# Patient Record
Sex: Male | Born: 1935 | Race: White | Hispanic: No | Marital: Married | Smoking: Former smoker
Health system: Southern US, Community
[De-identification: ages and names within clinical notes are randomized; demographics above are authoritative.]

## PROBLEM LIST (undated history)

## (undated) DIAGNOSIS — E785 Hyperlipidemia, unspecified: Secondary | ICD-10-CM

## (undated) DIAGNOSIS — N200 Calculus of kidney: Secondary | ICD-10-CM

## (undated) DIAGNOSIS — F329 Major depressive disorder, single episode, unspecified: Secondary | ICD-10-CM

## (undated) DIAGNOSIS — C61 Malignant neoplasm of prostate: Secondary | ICD-10-CM

## (undated) DIAGNOSIS — G4730 Sleep apnea, unspecified: Secondary | ICD-10-CM

## (undated) DIAGNOSIS — K219 Gastro-esophageal reflux disease without esophagitis: Secondary | ICD-10-CM

## (undated) DIAGNOSIS — I519 Heart disease, unspecified: Secondary | ICD-10-CM

## (undated) DIAGNOSIS — F0390 Unspecified dementia without behavioral disturbance: Secondary | ICD-10-CM

## (undated) DIAGNOSIS — J189 Pneumonia, unspecified organism: Secondary | ICD-10-CM

## (undated) DIAGNOSIS — I2510 Atherosclerotic heart disease of native coronary artery without angina pectoris: Secondary | ICD-10-CM

## (undated) DIAGNOSIS — F32A Depression, unspecified: Secondary | ICD-10-CM

## (undated) DIAGNOSIS — T8859XA Other complications of anesthesia, initial encounter: Secondary | ICD-10-CM

## (undated) DIAGNOSIS — I639 Cerebral infarction, unspecified: Secondary | ICD-10-CM

## (undated) DIAGNOSIS — N2889 Other specified disorders of kidney and ureter: Secondary | ICD-10-CM

## (undated) DIAGNOSIS — T4145XA Adverse effect of unspecified anesthetic, initial encounter: Secondary | ICD-10-CM

## (undated) HISTORY — PX: BACK SURGERY: SHX140

## (undated) HISTORY — PX: SKIN GRAFT: SHX250

## (undated) HISTORY — PX: CERVICAL DISCECTOMY: SHX98

## (undated) HISTORY — PX: MITRAL VALVE REPAIR: SHX2039

## (undated) HISTORY — PX: RETINAL DETACHMENT REPAIR W/ SCLERAL BUCKLE LE: SHX2338

## (undated) HISTORY — PX: LITHOTRIPSY: SUR834

## (undated) HISTORY — PX: LUMBAR LAMINECTOMY: SHX95

## (undated) HISTORY — PX: PARS PLANA VITRECTOMY W/ REPAIR OF MACULAR HOLE: SHX2170

## (undated) HISTORY — PX: CSF SHUNT: SHX92

## (undated) HISTORY — PX: PROSTATE SURGERY: SHX751

## (undated) HISTORY — PX: EYE SURGERY: SHX253

---

## 2011-04-16 HISTORY — PX: CORONARY ARTERY BYPASS GRAFT: SHX141

## 2015-08-07 HISTORY — PX: CARDIAC CATHETERIZATION: SHX172

## 2015-11-13 ENCOUNTER — Other Ambulatory Visit: Payer: Self-pay | Admitting: Urology

## 2015-11-13 DIAGNOSIS — N2889 Other specified disorders of kidney and ureter: Secondary | ICD-10-CM

## 2015-12-07 ENCOUNTER — Ambulatory Visit
Admission: RE | Admit: 2015-12-07 | Discharge: 2015-12-07 | Disposition: A | Discharge status: Home / Self Care | Payer: Self-pay | Source: Ambulatory Visit | Attending: Urology | Admitting: Urology

## 2015-12-07 ENCOUNTER — Encounter: Payer: Self-pay | Admitting: Radiology

## 2015-12-07 DIAGNOSIS — N2889 Other specified disorders of kidney and ureter: Secondary | ICD-10-CM

## 2015-12-07 HISTORY — DX: Gastro-esophageal reflux disease without esophagitis: K21.9

## 2015-12-07 HISTORY — DX: Sleep apnea, unspecified: G47.30

## 2015-12-07 HISTORY — DX: Major depressive disorder, single episode, unspecified: F32.9

## 2015-12-07 HISTORY — DX: Other specified disorders of kidney and ureter: N28.89

## 2015-12-07 HISTORY — PX: IR GENERIC HISTORICAL: IMG1180011

## 2015-12-07 HISTORY — DX: Atherosclerotic heart disease of native coronary artery without angina pectoris: I25.10

## 2015-12-07 HISTORY — DX: Hyperlipidemia, unspecified: E78.5

## 2015-12-07 HISTORY — DX: Cerebral infarction, unspecified: I63.9

## 2015-12-07 HISTORY — DX: Calculus of kidney: N20.0

## 2015-12-07 HISTORY — DX: Heart disease, unspecified: I51.9

## 2015-12-07 HISTORY — DX: Depression, unspecified: F32.A

## 2015-12-07 HISTORY — DX: Malignant neoplasm of prostate: C61

## 2015-12-07 NOTE — Consult Note (Signed)
Chief Complaint: Patient was seen in consultation today for ablation of a right renal mass at the request of Hall,Marshall C  Referring Physician(s): Hall,Marshall C  History of Present Illness: Kenneth Todd is a 82 y.o. male with imaging evidence of a 2.6 cm solid, enhancing right renal mass emanating from the lateral interpolar kidney. This mass was initially incidentally detected by CT on 09/16/2015 at [facility] during the workup of biliary sepsis and choledocholithiasis requiring ERCP with stone extraction. Further characterization was performed with MRI of the abdomen on 10/28/2015 at [facility]. This demonstrated a 2.6 cm solid mass demonstrating enhancement and consistent with probable renal carcinoma. This is largely an endophytic lesion with minimal protrusion into the central sinus fat and slight exophytic contour bulge. No evidence of regional metastatic disease or renal vein involvement by MRI. Kenneth Todd is asymptomatic with respect to the mass and has not had any flank pain or hematuria. He is now fully recovered after hospitalization for biliary sepsis and endoscopic biliary intervention.  Past Medical History:  Diagnosis Date  . Coronary artery disease   . Depression   . GERD (gastroesophageal reflux disease)   . Heart disease   . Hyperlipidemia   . Kidney stone   . Prostate cancer ([place])   . Right renal mass   . Sleep apnea   . Stroke [facility])     Past Surgical History:  Procedure Laterality Date  . BACK SURGERY    . CARDIAC CATHETERIZATION  08/07/2015  . CERVICAL DISCECTOMY    . CORONARY ARTERY BYPASS GRAFT  2013  . CSF SHUNT    . EYE SURGERY    . LITHOTRIPSY    . LUMBAR LAMINECTOMY    . MITRAL VALVE REPAIR    . PARS PLANA VITRECTOMY W/ REPAIR OF MACULAR HOLE    . PROSTATE SURGERY    . RETINAL DETACHMENT REPAIR W/ SCLERAL BUCKLE LE    . SKIN GRAFT      Allergies: Celecoxib; Cetirizine & related; Ezetimibe; Naproxen; Trazodone  and nefazodone; Hydrochlorothiazide; and Triamcinolone acetonide  Medications: Prior to Admission medications   Medication Sig Start Date End Date Taking? Authorizing Provider  ALPRAZolam Duanne Moron) 0.5 MG tablet Take 0.5 mg by mouth at bedtime as needed for anxiety. 1/2-1 tab three times each day as needed.   Yes Historical Provider, MD  atorvastatin (LIPITOR) 40 MG tablet Take 40 mg by mouth Todd.   Yes Historical Provider, MD  Memantine HCl ER (NAMENDA XR) 21 MG CP24 Take 21 mg by mouth Todd.   Yes Historical Provider, MD  valACYclovir (VALTREX) 500 MG tablet Take 500 mg by mouth 2 (two) times Todd as needed. Take 1-3 per day as needed for eve virus.   Yes Historical Provider, MD     No family history on file.  Social History   Social History  . Marital status: Married    Spouse name: N/A  . Number of children: N/A  . Years of education: N/A   Social History Main Topics  . Smoking status: Former Smoker    Packs/day: 1.00    Years: 26.00    Types: Cigarettes    Start date: 12/06/1948    Quit date: 12/07/1974  . Smokeless tobacco: Never Used  . Alcohol use No     Comment: stopped about 7 yrs ago    . Drug use: Unknown  . Sexual activity: Not Asked   Other Topics Concern  . None  Social History Narrative  . None    Review of Systems: A 12 point ROS discussed and pertinent positives are indicated in the HPI above.  All other systems are negative.  Review of Systems  Constitutional: Negative.   Respiratory: Negative.   Cardiovascular: Negative.   Gastrointestinal: Negative.   Genitourinary: Negative.   Musculoskeletal: Negative.   Neurological: Negative.   Hematological: Negative.     Vital Signs: BP (P) 132/81 (BP Location: Left Arm, Patient Position: Sitting, Cuff Size: Normal)   Pulse (P) 71   Temp (P) 97.6 F (36.4 C) (Oral)   Resp (P) 15   Ht _0  (1.778 m)   Wt 190 lb (86.2 kg)   SpO2 (P) 97%   BMI 27.26 kg/m   Physical Exam  Constitutional: He  is oriented to person, place, and time. He appears well-developed and well-nourished. No distress.  Neck: Neck supple. No JVD present.  Cardiovascular: Normal rate, regular rhythm and normal heart sounds.  Exam reveals no gallop and no friction rub.   No murmur heard. Pulmonary/Chest: Effort normal and breath sounds normal. No respiratory distress. He has no wheezes. He has no rales.  Abdominal: Soft. Bowel sounds are normal. He exhibits no distension and no mass. There is no tenderness. There is no rebound and no guarding.  Musculoskeletal: He exhibits no edema.  Lymphadenopathy:    He has no cervical adenopathy.  Neurological: He is alert and oriented to person, place, and time.  Skin: He is not diaphoretic.  Nursing note and vitals reviewed.   Imaging: CT of the abdomen and pelvis with contrast on 09/16/2015 as well as MRI of the abdomen with and without contrast on 10/28/2015 were reviewed.  Labs:  BUN 14, Cr 0.7 and GFR 87 mL/min on 10/12/15.  Assessment and Plan:  I met with Kenneth Todd and his wife. We reviewed imaging studies that reveal a 2.6 cm, rounded, enhancing solid mass of the right kidney most likely representing renal carcinoma. The mass is of a size and location amenable to percutaneous cryoablation. Given his medical comorbidities, he is not the best candidate for partial nephrectomy. Details of percutaneous ablation were discussed with the patient and his wife, including technical details, risks and typical follow-up. The procedure would be performed at [facility] and requires overnight observation.  Kenneth Todd states that he was a difficult intubation for a cervical spine surgery in the past which resulted in esophageal injury. We will let anesthesia know at [facility].  He has had several other uncomplicated intubations for multiple prior surgical procedures. Given history of CABG in 2013 and prior mitral valve replacement, we will contact Dr. Chrissie Noa  Rhinehart's office to determine whether he needs to be seen for formal cardiac clearance prior to ablation of the renal tumor.  In the meantime, we will begin the authorization and scheduling process.  Thank you for this interesting consult.  I greatly enjoyed meeting Kenneth Todd and look forward to participating in their care.  A copy of this report was sent to the requesting provider on this date.  Electronically SignedAletta Edouard T 12/07/2015, 11:14 AM   I spent a total of 40 Minutes in face to face in clinical consultation, greater than 50% of which was counseling/coordinating care for ablation of a right renal tumor.

## 2015-12-12 ENCOUNTER — Inpatient Hospital Stay: Admission: RE | Admit: 2015-12-12 | Payer: Self-pay | Source: Ambulatory Visit

## 2016-01-25 HISTORY — PX: OTHER SURGICAL HISTORY: SHX169

## 2016-01-30 ENCOUNTER — Other Ambulatory Visit: Payer: Self-pay | Admitting: *Deleted

## 2016-01-30 ENCOUNTER — Other Ambulatory Visit (HOSPITAL_COMMUNITY): Payer: Self-pay | Admitting: Interventional Radiology

## 2016-01-30 DIAGNOSIS — N2889 Other specified disorders of kidney and ureter: Secondary | ICD-10-CM

## 2016-02-22 LAB — CREATININE WITH EST GFR
CREATININE: 0.98 mg/dL (ref 0.70–1.11)
GFR, EST AFRICAN AMERICAN: 82 mL/min (ref >=60)
GFR, Est Non African American: 71 mL/min (ref >=60)

## 2016-02-22 LAB — BUN: BUN: 23 mg/dL (ref 7–25)

## 2016-02-27 ENCOUNTER — Ambulatory Visit
Admission: RE | Admit: 2016-02-27 | Discharge: 2016-02-27 | Disposition: A | Discharge status: Home / Self Care | Payer: Self-pay | Source: Ambulatory Visit | Attending: Interventional Radiology | Admitting: Interventional Radiology

## 2016-02-27 DIAGNOSIS — N2889 Other specified disorders of kidney and ureter: Secondary | ICD-10-CM

## 2016-02-27 HISTORY — PX: IR GENERIC HISTORICAL: IMG1180011

## 2016-02-27 NOTE — Progress Notes (Signed)
Chief Complaint: Status post cryoablation of a right clear cell renal carcinoma on 01/25/2016.  History of Present Illness: Kenneth Todd is a 82 y.o. male status post biopsy and percutaneous cryoablation of a right renal tumor on 01/25/2016. 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 He did well after the procedure. He states that it did take him a couple weeks to fully recover from general anesthesia. He now feels that he is back to his baseline. He is asymptomatic.  Past Medical History:  Diagnosis Date  . Coronary artery disease   . Depression   . GERD (gastroesophageal reflux disease)   . Heart disease   . Hyperlipidemia   . Kidney stone   . Prostate cancer ([place])   . Right renal mass   . Sleep apnea   . Stroke [facility])     Past Surgical History:  Procedure Laterality Date  . BACK SURGERY    . CARDIAC CATHETERIZATION  08/07/2015  . CERVICAL DISCECTOMY    . CORONARY ARTERY BYPASS GRAFT  2013  . Cryoablation of Right Renal Clear Cell Carcinoma Right 01/25/2016  . CSF SHUNT    . EYE SURGERY    . LITHOTRIPSY    . LUMBAR LAMINECTOMY    . MITRAL VALVE REPAIR    . PARS PLANA VITRECTOMY W/ REPAIR OF MACULAR HOLE    . PROSTATE SURGERY    . RETINAL DETACHMENT REPAIR W/ SCLERAL BUCKLE LE    . SKIN GRAFT      Allergies: Celecoxib; Cetirizine & related; Ezetimibe; Naproxen; Trazodone and nefazodone; Hydrochlorothiazide; and Triamcinolone acetonide  Medications: Prior to Admission medications   Medication Sig Start Date End Date Taking? Authorizing Provider  acetaminophen (TYLENOL) 500 MG chewable tablet Chew 500 mg by mouth every 6 (six) hours as needed for pain.    Historical Provider, MD  ALPRAZolam Duanne Moron) 0.5 MG tablet Take 0.5 mg by mouth at bedtime as needed for anxiety. 1/2-1 tab three times each day as needed.    Historical Provider, MD  aspirin 81 MG chewable tablet Chew by mouth daily.    Historical Provider, MD  atorvastatin  (LIPITOR) 40 MG tablet Take 40 mg by mouth daily.    Historical Provider, MD  cholecalciferol (VITAMIN D) 1000 units tablet Take 1,000 Units by mouth daily.    Historical Provider, MD  co-enzyme Q-10 30 MG capsule Take 30 mg by mouth daily.    Historical Provider, MD  Cyanocobalamin (VITAMIN B 12 PO) Take 1 tablet by mouth 2 (two) times daily.    Historical Provider, MD  diphenhydrAMINE (BENADRYL) 25 mg capsule Take 25 mg by mouth every 6 (six) hours as needed.    Historical Provider, MD  hydrocodone-acetaminophen (ZYDONE) 7.5-400 MG tablet Take 1 tablet by mouth 2 (two) times daily as needed for pain.    Historical Provider, MD  Memantine HCl ER (NAMENDA XR) 21 MG CP24 Take 21 mg by mouth daily.    Historical Provider, MD  metoprolol succinate (TOPROL-XL) 25 MG 24 hr tablet Take 25 mg by mouth 2 (two) times daily.    Historical Provider, MD  nitroGLYCERIN (NITROSTAT) 0.4 MG SL tablet Place 0.4 mg under the tongue every 5 (five) minutes as needed for chest pain.    Historical Provider, MD  ondansetron (ZOFRAN) 4 MG tablet Take 4 mg by mouth every 8 (eight) hours as needed for nausea or vomiting.    Historical Provider, MD  sertraline (ZOLOFT) 100 MG tablet Take 100 mg by  mouth 2 (two) times daily.    Historical Provider, MD  traMADol (ULTRAM) 50 MG tablet Take by mouth every 6 (six) hours as needed.    Historical Provider, MD  valACYclovir (VALTREX) 500 MG tablet Take 500 mg by mouth 2 (two) times daily as needed. Take 1-3 per day as needed for eve virus.    Historical Provider, MD     No family history on file.  Social History   Social History  . Marital status: Married    Spouse name: N/A  . Number of children: N/A  . Years of education: N/A   Social History Main Topics  . Smoking status: Former Smoker    Packs/day: 1.00    Years: 26.00    Types: Cigarettes    Start date: 12/06/1948    Quit date: 12/07/1974  . Smokeless tobacco: Never Used  . Alcohol use No     Comment: stopped  about 7 yrs ago    . Drug use: Unknown  . Sexual activity: Not on file   Other Topics Concern  . Not on file   Social History Narrative  . No narrative on file    ECOG Status: 0 - Asymptomatic  Review of Systems: A 12 point ROS discussed and pertinent positives are indicated in the HPI above.  All other systems are negative.  Review of Systems  Constitutional: Negative.   Respiratory: Negative.   Cardiovascular: Negative.   Gastrointestinal: Negative.   Genitourinary: Negative.   Musculoskeletal: Negative.   Neurological: Negative.     Vital Signs: BP 137/66 (BP Location: Left Arm, Patient Position: Sitting, Cuff Size: Normal)   Pulse (!) 59   Temp 97.9 F (36.6 C) (Oral)   Resp 14   Ht 5\' 10"  (1.778 m)   Wt 200 lb (90.7 kg)   SpO2 98%   BMI 28.70 kg/m   Physical Exam  Constitutional: He is oriented to person, place, and time. He appears well-developed and well-nourished. No distress.  Abdominal: Soft. He exhibits no distension and no mass. There is no tenderness. There is no rebound and no guarding.  Neurological: He is alert and oriented to person, place, and time.  Skin: He is not diaphoretic.  Nursing note and vitals reviewed.   Imaging: No results found.  Labs:  CBC: No results for input(s): WBC, HGB, HCT, PLT in the last 8760 hours.  COAGS: No results for input(s): INR, APTT in the last 8760 hours.  BMP:  Recent Labs  02/21/16 1430  BUN 23  CREATININE 0.98  GFRNONAA 71  GFRAA 82    Assessment and Plan:  Renal function is stable and within normal limits after ablation. I recommended that we perform a follow-up CT of the abdomen with and without contrast in January, 3 months after ablation. He may be in [place] at that time for the winter but still has not decided whether he and his wife will be traveling to [place]. If he is away, we will schedule the CT as soon as he returns back to [place].  Electronically SignedAletta Edouard  T 02/27/2016, 12:46 PM   I spent a total of 15 Minutes in face to face in clinical consultation, greater than 50% of which was counseling/coordinating care post cryoablation of a right renal carcinoma.

## 2016-03-05 ENCOUNTER — Encounter: Payer: Self-pay | Admitting: Interventional Radiology

## 2016-04-18 ENCOUNTER — Encounter: Payer: Self-pay | Admitting: Interventional Radiology

## 2016-04-22 ENCOUNTER — Other Ambulatory Visit: Payer: Self-pay | Admitting: *Deleted

## 2016-04-22 DIAGNOSIS — C641 Malignant neoplasm of right kidney, except renal pelvis: Secondary | ICD-10-CM

## 2016-04-25 ENCOUNTER — Other Ambulatory Visit: Payer: Self-pay | Admitting: Interventional Radiology

## 2016-04-25 ENCOUNTER — Other Ambulatory Visit: Payer: Self-pay | Admitting: *Deleted

## 2016-04-25 DIAGNOSIS — C641 Malignant neoplasm of right kidney, except renal pelvis: Secondary | ICD-10-CM

## 2016-05-08 ENCOUNTER — Ambulatory Visit
Admission: RE | Admit: 2016-05-08 | Discharge: 2016-05-08 | Disposition: A | Discharge status: Home / Self Care | Payer: Self-pay | Source: Ambulatory Visit | Attending: Interventional Radiology | Admitting: Interventional Radiology

## 2016-05-08 ENCOUNTER — Other Ambulatory Visit: Payer: Self-pay | Admitting: Interventional Radiology

## 2016-05-08 ENCOUNTER — Encounter: Payer: Self-pay | Admitting: Radiology

## 2016-05-08 ENCOUNTER — Ambulatory Visit
Admission: RE | Admit: 2016-05-08 | Discharge: 2016-05-08 | Disposition: A | Discharge status: Home / Self Care | Payer: Medicare Other | Source: Ambulatory Visit | Attending: Interventional Radiology | Admitting: Interventional Radiology

## 2016-05-08 DIAGNOSIS — C641 Malignant neoplasm of right kidney, except renal pelvis: Secondary | ICD-10-CM

## 2016-05-08 HISTORY — PX: IR GENERIC HISTORICAL: IMG1180011

## 2016-05-08 NOTE — Progress Notes (Signed)
Chief Complaint: Patient was seen in consultation today for  Chief Complaint  Patient presents with  . Follow-up    3.5 mo follow up Cryoablation of Right Renal Clear Cell Carcinoma     Referring Physician(s): Jonette Eva  Supervising Physician: Aletta Edouard  History of Present Illness: Kenneth Todd is a 83 y.o. male with history of imaging evidence of a 2.6 cm solid, enhancing right renal mass emanating from the lateral interpolar kidney.   This mass was initially incidentally detected by CT on 09/16/2015 at [facility] during the workup of biliary sepsis and choledocholithiasis requiring ERCP with stone extraction.   Further characterization was performed with MRI of the abdomen on 10/28/2015 at [facility].   This demonstrated a 2.6 cm solid mass demonstrating enhancement and consistent with probable renal carcinoma.   This is largely an endophytic lesion with minimal protrusion into the central sinus fat and slight exophytic contour bulge. No evidence of regional metastatic disease or renal vein involvement by MRI.   He underwent cryoablation by Dr. Kathlene Cote on 01/25/2016.  He is doing well from the cryo standpoint. He only complains of losing his hearing and his eye sight is deteriorating.  He also describes some sort of allergic rash he gets occasionally on his left flank. He states he has seen several doctors and "no one can figure it out".  CT scan done at an outside facility reveals small residual tumor.  Past Medical History:  Diagnosis Date  . Coronary artery disease   . Depression   . GERD (gastroesophageal reflux disease)   . Heart disease   . Hyperlipidemia   . Kidney stone   . Prostate cancer ([place])   . Right renal mass   . Sleep apnea   . Stroke [facility])     Past Surgical History:  Procedure Laterality Date  . BACK SURGERY    . CARDIAC CATHETERIZATION  08/07/2015  . CERVICAL DISCECTOMY    . CORONARY ARTERY BYPASS GRAFT   2013  . Cryoablation of Right Renal Clear Cell Carcinoma Right 01/25/2016  . CSF SHUNT    . EYE SURGERY    . IR GENERIC HISTORICAL  02/27/2016   IR RADIOLOGIST EVAL & MGMT 02/27/2016 Aletta Edouard, MD [facility] INTERV RAD  . IR GENERIC HISTORICAL  12/07/2015   IR RADIOLOGIST EVAL & MGMT 12/07/2015 Aletta Edouard, MD [facility] INTERV RAD  . LITHOTRIPSY    . LUMBAR LAMINECTOMY    . MITRAL VALVE REPAIR    . PARS PLANA VITRECTOMY W/ REPAIR OF MACULAR HOLE    . PROSTATE SURGERY    . RETINAL DETACHMENT REPAIR W/ SCLERAL BUCKLE LE    . SKIN GRAFT      Allergies: Celecoxib; Cetirizine & related; Ezetimibe; Naproxen; Trazodone and nefazodone; Hydrochlorothiazide; and Triamcinolone acetonide  Medications: Prior to Admission medications   Medication Sig Start Date End Date Taking? Authorizing Provider  ALPRAZolam Duanne Moron) 0.5 MG tablet Take 0.5 mg by mouth at bedtime as needed for anxiety. 1/2-1 tab three times each day as needed.   Yes Historical Provider, MD  aspirin 81 MG chewable tablet Chew by mouth daily.   Yes Historical Provider, MD  atorvastatin (LIPITOR) 40 MG tablet Take 40 mg by mouth daily.   Yes Historical Provider, MD  cholecalciferol (VITAMIN D) 1000 units tablet Take 1,000 Units by mouth daily.   Yes Historical Provider, MD  co-enzyme Q-10 30 MG capsule Take 30 mg by mouth daily.   Yes Historical Provider,  MD  Cyanocobalamin (VITAMIN B 12 PO) Take 1 tablet by mouth 2 (two) times daily.   Yes Historical Provider, MD  diphenhydrAMINE (BENADRYL) 25 mg capsule Take 25 mg by mouth every 6 (six) hours as needed.   Yes Historical Provider, MD  hydrocodone-acetaminophen (ZYDONE) 7.5-400 MG tablet Take 1 tablet by mouth 2 (two) times daily as needed for pain.   Yes Historical Provider, MD  Memantine HCl ER (NAMENDA XR) 21 MG CP24 Take 21 mg by mouth daily.   Yes Historical Provider, MD  metoprolol succinate (TOPROL-XL) 25 MG 24 hr tablet Take 25 mg by mouth 2 (two) times daily.   Yes Historical  Provider, MD  nitroGLYCERIN (NITROSTAT) 0.4 MG SL tablet Place 0.4 mg under the tongue every 5 (five) minutes as needed for chest pain.   Yes Historical Provider, MD  sertraline (ZOLOFT) 100 MG tablet Take 100 mg by mouth 2 (two) times daily.   Yes Historical Provider, MD  traMADol (ULTRAM) 50 MG tablet Take by mouth every 6 (six) hours as needed.   Yes Historical Provider, MD  valACYclovir (VALTREX) 500 MG tablet Take 500 mg by mouth 2 (two) times daily as needed. Take 1-3 per day as needed for eve virus.   Yes Historical Provider, MD  acetaminophen (TYLENOL) 500 MG chewable tablet Chew 500 mg by mouth every 6 (six) hours as needed for pain.    Historical Provider, MD  ondansetron (ZOFRAN) 4 MG tablet Take 4 mg by mouth every 8 (eight) hours as needed for nausea or vomiting.    Historical Provider, MD     No family history on file.  Social History   Social History  . Marital status: Married    Spouse name: N/A  . Number of children: N/A  . Years of education: N/A   Social History Main Topics  . Smoking status: Former Smoker    Packs/day: 1.00    Years: 26.00    Types: Cigarettes    Start date: 12/06/1948    Quit date: 12/07/1974  . Smokeless tobacco: Never Used  . Alcohol use No     Comment: stopped about 7 yrs ago    . Drug use: Unknown  . Sexual activity: Not on file   Other Topics Concern  . Not on file   Social History Narrative  . No narrative on file     Review of Systems: A 12 point ROS discussed  Review of Systems  Constitutional: Negative.   HENT: Positive for hearing loss.   Eyes: Positive for visual disturbance.  Respiratory: Negative.   Cardiovascular: Negative.   Gastrointestinal: Negative.   Genitourinary: Negative.   Musculoskeletal: Negative.   Skin: Negative.   Neurological: Negative.   Hematological: Negative.   Psychiatric/Behavioral: Negative.     Vital Signs: BP (!) 146/78 (BP Location: Left Arm, Patient Position: Sitting, Cuff Size:  Normal)   Pulse 74   Temp 98 F (36.7 C) (Oral)   Resp 15   Ht 5\' 10"  (1.778 m)   Wt 200 lb (90.7 kg)   SpO2 97%   BMI 28.70 kg/m   Physical Exam  Constitutional: He is oriented to person, place, and time. He appears well-developed and well-nourished.  HENT:  Head: Normocephalic and atraumatic.  Eyes: EOM are normal.  Neck: Normal range of motion.  Cardiovascular: Normal rate, regular rhythm and normal heart sounds.   No murmur heard. Pulmonary/Chest: Effort normal and breath sounds normal. No respiratory distress. He has no wheezes.  Abdominal:  Soft. He exhibits no distension. There is no tenderness.  Musculoskeletal: Normal range of motion.  Neurological: He is alert and oriented to person, place, and time.  Skin: Skin is warm and dry.  Psychiatric: He has a normal mood and affect. His behavior is normal. Judgment and thought content normal.  Vitals reviewed.   Imaging: No results found.  Labs:  CBC: No results for input(s): WBC, HGB, HCT, PLT in the last 8760 hours.  COAGS: No results for input(s): INR, APTT in the last 8760 hours.  BMP:  Recent Labs  02/21/16 1430  BUN 23  CREATININE 0.98  GFRNONAA 71  GFRAA 82    LIVER FUNCTION TESTS: No results for input(s): BILITOT, AST, ALT, ALKPHOS, PROT, ALBUMIN in the last 8760 hours.  TUMOR MARKERS: No results for input(s): AFPTM, CEA, CA199, CHROMGRNA in the last 8760 hours.  Assessment:  History of a 2.6 cm solid mass demonstrating enhancement and consistent with probable renal carcinoma.   Cryoablation by Dr. Kathlene Cote on 01/25/2016.  CT scan done at an outside facility reveals small residual tumor.  Will proceed with scheduling another cryoablation. This time will do at [facility] as patient had difficulty with waking up from anesthesia last time. Dr. Kathlene Cote feels he may do better with different anesthesia team and also more convenient for Dr. Kathlene Cote and the patient.  Risks and Benefits discussed  with the patient including, but not limited to failure to treat entire lesion, bleeding, infection, damage to adjacent structures, decrease in renal function or post procedural neuropathy.  All of the patient's questions were answered, patient is agreeable to proceed.  Electronically Signed: Murrell Redden PA-C 05/08/2016, 4:44 PM   Please refer to Dr. Margaretmary Dys attestation of this note for management and plan.

## 2016-05-20 ENCOUNTER — Other Ambulatory Visit: Payer: Self-pay | Admitting: Interventional Radiology

## 2016-05-20 DIAGNOSIS — C641 Malignant neoplasm of right kidney, except renal pelvis: Secondary | ICD-10-CM

## 2016-06-04 ENCOUNTER — Other Ambulatory Visit: Payer: Self-pay | Admitting: Radiology

## 2016-06-10 ENCOUNTER — Other Ambulatory Visit: Payer: Self-pay | Admitting: Radiology

## 2016-06-17 NOTE — Progress Notes (Addendum)
ekg 08-03-15 on chart Stress test 07-21-15 on chart ECHO 07-20-15 on chart  CXR 03-18-16 on chart

## 2016-06-17 NOTE — Patient Instructions (Addendum)
Mackinley Bullen  06/17/2016   Your procedure is scheduled on: 06-21-16  Report to [facility] Main  Entrance take [facility]  elevators to 3rd floor to  [place] at 630AM.  Call this number if you have problems the morning of surgery [phone]   Remember: ONLY 1 PERSON MAY GO WITH YOU TO SHORT STAY TO GET  READY MORNING OF [place].  Do not eat food or drink liquids :After Midnight.     Take these medicines the morning of surgery with A SIP OF WATER: tylenol as needed, xanax as needed, metoprolol(toprol xl), sertraline(zoloft),                                 You may not have any metal on your body including hair pins and              piercings  Do not wear jewelry, make-up, lotions, powders or perfumes, deodorant             Do not wear nail polish.  Do not shave  48 hours prior to surgery.              Men may shave face and neck.   Do not bring valuables to the hospital. [place].  Contacts, dentures or bridgework may not be worn into surgery.  Leave suitcase in the car. After surgery it may be brought to your room.   Bring cap mask and tubing. Device will be provided.               Please read over the following fact sheets you were given: _____________________________________________________________________             [facility] - Preparing for Surgery Before surgery, you can play an important role.  Because skin is not sterile, your skin needs to be as free of germs as possible.  You can reduce the number of germs on your skin by washing with CHG (chlorahexidine gluconate) soap before surgery.  CHG is an antiseptic cleaner which kills germs and bonds with the skin to continue killing germs even after washing. Please DO NOT use if you have an allergy to CHG or antibacterial soaps.  If your skin becomes reddened/irritated stop using the CHG and inform your nurse when you arrive at Short Stay. Do  not shave (including legs and underarms) for at least 48 hours prior to the first CHG shower.  You may shave your face/neck. Please follow these instructions carefully:  1.  Shower with CHG Soap the night before surgery and the  morning of Surgery.  2.  If you choose to wash your hair, wash your hair first as usual with your  normal  shampoo.  3.  After you shampoo, rinse your hair and body thoroughly to remove the  shampoo.                           4.  Use CHG as you would any other liquid soap.  You can apply chg directly  to the skin and wash  Gently with a scrungie or clean washcloth.  5.  Apply the CHG Soap to your body ONLY FROM THE NECK DOWN.   Do not use on face/ open                           Wound or open sores. Avoid contact with eyes, ears mouth and genitals (private parts).                       Wash face,  Genitals (private parts) with your normal soap.             6.  Wash thoroughly, paying special attention to the area where your surgery  will be performed.  7.  Thoroughly rinse your body with warm water from the neck down.  8.  DO NOT shower/wash with your normal soap after using and rinsing off  the CHG Soap.                9.  Pat yourself dry with a clean towel.            10.  Wear clean pajamas.            11.  Place clean sheets on your bed the night of your first shower and do not  sleep with pets. Day of Surgery : Do not apply any lotions/deodorants the morning of surgery.  Please wear clean clothes to the hospital/surgery center.  FAILURE TO FOLLOW THESE INSTRUCTIONS MAY RESULT IN THE CANCELLATION OF YOUR SURGERY PATIENT SIGNATURE_________________________________  NURSE SIGNATURE__________________________________  ________________________________________________________________________

## 2016-06-18 ENCOUNTER — Encounter (HOSPITAL_COMMUNITY): Payer: Self-pay

## 2016-06-18 ENCOUNTER — Encounter (HOSPITAL_COMMUNITY)
Admission: RE | Admit: 2016-06-18 | Discharge: 2016-06-18 | Disposition: A | Discharge status: Home / Self Care | Payer: Medicare Other | Source: Ambulatory Visit | Attending: Interventional Radiology | Admitting: Interventional Radiology

## 2016-06-18 DIAGNOSIS — Z9889 Other specified postprocedural states: Secondary | ICD-10-CM | POA: Diagnosis not present

## 2016-06-18 DIAGNOSIS — E785 Hyperlipidemia, unspecified: Secondary | ICD-10-CM | POA: Diagnosis not present

## 2016-06-18 DIAGNOSIS — Z01818 Encounter for other preprocedural examination: Secondary | ICD-10-CM

## 2016-06-18 DIAGNOSIS — Z9109 Other allergy status, other than to drugs and biological substances: Secondary | ICD-10-CM | POA: Diagnosis not present

## 2016-06-18 DIAGNOSIS — Z01812 Encounter for preprocedural laboratory examination: Secondary | ICD-10-CM

## 2016-06-18 DIAGNOSIS — Z87891 Personal history of nicotine dependence: Secondary | ICD-10-CM | POA: Diagnosis not present

## 2016-06-18 DIAGNOSIS — Z951 Presence of aortocoronary bypass graft: Secondary | ICD-10-CM | POA: Diagnosis not present

## 2016-06-18 DIAGNOSIS — K219 Gastro-esophageal reflux disease without esophagitis: Secondary | ICD-10-CM | POA: Diagnosis not present

## 2016-06-18 DIAGNOSIS — Z85528 Personal history of other malignant neoplasm of kidney: Secondary | ICD-10-CM | POA: Diagnosis not present

## 2016-06-18 DIAGNOSIS — Z8546 Personal history of malignant neoplasm of prostate: Secondary | ICD-10-CM | POA: Diagnosis not present

## 2016-06-18 DIAGNOSIS — Z79899 Other long term (current) drug therapy: Secondary | ICD-10-CM | POA: Diagnosis not present

## 2016-06-18 DIAGNOSIS — Z886 Allergy status to analgesic agent status: Secondary | ICD-10-CM | POA: Diagnosis not present

## 2016-06-18 DIAGNOSIS — I2510 Atherosclerotic heart disease of native coronary artery without angina pectoris: Secondary | ICD-10-CM | POA: Diagnosis not present

## 2016-06-18 DIAGNOSIS — C641 Malignant neoplasm of right kidney, except renal pelvis: Secondary | ICD-10-CM | POA: Insufficient documentation

## 2016-06-18 DIAGNOSIS — F0390 Unspecified dementia without behavioral disturbance: Secondary | ICD-10-CM | POA: Diagnosis not present

## 2016-06-18 DIAGNOSIS — F329 Major depressive disorder, single episode, unspecified: Secondary | ICD-10-CM | POA: Diagnosis not present

## 2016-06-18 DIAGNOSIS — G4730 Sleep apnea, unspecified: Secondary | ICD-10-CM | POA: Diagnosis not present

## 2016-06-18 DIAGNOSIS — Z8673 Personal history of transient ischemic attack (TIA), and cerebral infarction without residual deficits: Secondary | ICD-10-CM | POA: Diagnosis not present

## 2016-06-18 DIAGNOSIS — Z87442 Personal history of urinary calculi: Secondary | ICD-10-CM | POA: Diagnosis not present

## 2016-06-18 DIAGNOSIS — Z79891 Long term (current) use of opiate analgesic: Secondary | ICD-10-CM | POA: Diagnosis not present

## 2016-06-18 HISTORY — DX: Adverse effect of unspecified anesthetic, initial encounter: T41.45XA

## 2016-06-18 HISTORY — DX: Other complications of anesthesia, initial encounter: T88.59XA

## 2016-06-18 HISTORY — DX: Pneumonia, unspecified organism: J18.9

## 2016-06-18 HISTORY — DX: Unspecified dementia, unspecified severity, without behavioral disturbance, psychotic disturbance, mood disturbance, and anxiety: F03.90

## 2016-06-18 LAB — BASIC METABOLIC PANEL
Anion gap: 6 (ref 5–15)
BUN: 19 mg/dL (ref 6–20)
CALCIUM: 9.4 mg/dL (ref 8.9–10.3)
CHLORIDE: 109 mmol/L (ref 101–111)
CO2: 26 mmol/L (ref 22–32)
CREATININE: 1.13 mg/dL (ref 0.61–1.24)
GFR calc non Af Amer: 58 mL/min — ABNORMAL LOW (ref >60)
Glucose, Bld: 89 mg/dL (ref 65–99)
Potassium: 4.9 mmol/L (ref 3.5–5.1)
Sodium: 141 mmol/L (ref 135–145)

## 2016-06-18 LAB — CBC WITH DIFFERENTIAL/PLATELET
BASOS ABS: 0 10*3/uL (ref 0.0–0.1)
BASOS PCT: 0 %
Eosinophils Absolute: 0.1 10*3/uL (ref 0.0–0.7)
Eosinophils Relative: 2 %
HEMATOCRIT: 43.9 % (ref 39.0–52.0)
HEMOGLOBIN: 14.7 g/dL (ref 13.0–17.0)
LYMPHS PCT: 30 %
Lymphs Abs: 1.9 10*3/uL (ref 0.7–4.0)
MCH: 30.6 pg (ref 26.0–34.0)
MCHC: 33.5 g/dL (ref 30.0–36.0)
MCV: 91.5 fL (ref 78.0–100.0)
Monocytes Absolute: 0.5 10*3/uL (ref 0.1–1.0)
Monocytes Relative: 8 %
NEUTROS PCT: 60 %
Neutro Abs: 3.8 10*3/uL (ref 1.7–7.7)
Platelets: 142 10*3/uL — ABNORMAL LOW (ref 150–400)
RBC: 4.8 MIL/uL (ref 4.22–5.81)
RDW: 12.2 % (ref 11.5–15.5)
WBC: 6.3 10*3/uL (ref 4.0–10.5)

## 2016-06-18 LAB — ABO/RH: ABO/RH(D): O POS

## 2016-06-18 LAB — PROTIME-INR
INR: 1.01
Prothrombin Time: 13.3 seconds (ref 11.4–15.2)

## 2016-06-18 NOTE — Progress Notes (Addendum)
Consulted Dr Royce Macadamia concerning surgery on 06-21-16 during which he reviewed patient PMH and cardiac standing. No further recommendations nor orders received.

## 2016-06-19 ENCOUNTER — Other Ambulatory Visit: Payer: Self-pay | Admitting: Radiology

## 2016-06-20 ENCOUNTER — Other Ambulatory Visit: Payer: Self-pay | Admitting: Student

## 2016-06-21 ENCOUNTER — Encounter (HOSPITAL_COMMUNITY): Payer: Self-pay

## 2016-06-21 ENCOUNTER — Ambulatory Visit (HOSPITAL_COMMUNITY): Payer: Medicare Other | Admitting: Registered Nurse

## 2016-06-21 ENCOUNTER — Encounter (HOSPITAL_COMMUNITY)
Admission: RE | Disposition: A | Discharge status: Home / Self Care | Payer: Self-pay | Source: Ambulatory Visit | Attending: Interventional Radiology

## 2016-06-21 ENCOUNTER — Ambulatory Visit (HOSPITAL_COMMUNITY)
Admission: RE | Admit: 2016-06-21 | Discharge: 2016-06-21 | Disposition: A | Discharge status: Home / Self Care | Payer: Medicare Other | Source: Ambulatory Visit | Attending: Interventional Radiology | Admitting: Interventional Radiology

## 2016-06-21 ENCOUNTER — Observation Stay (HOSPITAL_COMMUNITY)
Admission: RE | Admit: 2016-06-21 | Discharge: 2016-06-22 | Disposition: A | Discharge status: Home / Self Care | Payer: Medicare Other | Source: Ambulatory Visit | Attending: Interventional Radiology | Admitting: Interventional Radiology

## 2016-06-21 ENCOUNTER — Ambulatory Visit (HOSPITAL_COMMUNITY): Payer: Medicare Other | Admitting: Anesthesiology

## 2016-06-21 ENCOUNTER — Encounter (HOSPITAL_COMMUNITY): Payer: Self-pay | Admitting: Registered Nurse

## 2016-06-21 DIAGNOSIS — Z87891 Personal history of nicotine dependence: Secondary | ICD-10-CM | POA: Diagnosis not present

## 2016-06-21 DIAGNOSIS — G4730 Sleep apnea, unspecified: Secondary | ICD-10-CM | POA: Insufficient documentation

## 2016-06-21 DIAGNOSIS — C641 Malignant neoplasm of right kidney, except renal pelvis: Secondary | ICD-10-CM

## 2016-06-21 DIAGNOSIS — Z79899 Other long term (current) drug therapy: Secondary | ICD-10-CM | POA: Insufficient documentation

## 2016-06-21 DIAGNOSIS — Z87442 Personal history of urinary calculi: Secondary | ICD-10-CM | POA: Insufficient documentation

## 2016-06-21 DIAGNOSIS — K219 Gastro-esophageal reflux disease without esophagitis: Secondary | ICD-10-CM | POA: Insufficient documentation

## 2016-06-21 DIAGNOSIS — Z9109 Other allergy status, other than to drugs and biological substances: Secondary | ICD-10-CM | POA: Insufficient documentation

## 2016-06-21 DIAGNOSIS — Z79891 Long term (current) use of opiate analgesic: Secondary | ICD-10-CM | POA: Insufficient documentation

## 2016-06-21 DIAGNOSIS — Z8546 Personal history of malignant neoplasm of prostate: Secondary | ICD-10-CM | POA: Insufficient documentation

## 2016-06-21 DIAGNOSIS — F329 Major depressive disorder, single episode, unspecified: Secondary | ICD-10-CM | POA: Insufficient documentation

## 2016-06-21 DIAGNOSIS — E785 Hyperlipidemia, unspecified: Secondary | ICD-10-CM | POA: Insufficient documentation

## 2016-06-21 DIAGNOSIS — Z951 Presence of aortocoronary bypass graft: Secondary | ICD-10-CM | POA: Insufficient documentation

## 2016-06-21 DIAGNOSIS — Z886 Allergy status to analgesic agent status: Secondary | ICD-10-CM | POA: Insufficient documentation

## 2016-06-21 DIAGNOSIS — Z9889 Other specified postprocedural states: Secondary | ICD-10-CM | POA: Insufficient documentation

## 2016-06-21 DIAGNOSIS — F0390 Unspecified dementia without behavioral disturbance: Secondary | ICD-10-CM | POA: Insufficient documentation

## 2016-06-21 DIAGNOSIS — Z85528 Personal history of other malignant neoplasm of kidney: Secondary | ICD-10-CM | POA: Diagnosis not present

## 2016-06-21 DIAGNOSIS — I2510 Atherosclerotic heart disease of native coronary artery without angina pectoris: Secondary | ICD-10-CM | POA: Insufficient documentation

## 2016-06-21 DIAGNOSIS — Z8673 Personal history of transient ischemic attack (TIA), and cerebral infarction without residual deficits: Secondary | ICD-10-CM | POA: Insufficient documentation

## 2016-06-21 HISTORY — PX: RADIOFREQUENCY ABLATION: SHX2290

## 2016-06-21 LAB — TYPE AND SCREEN
ABO/RH(D): O POS
Antibody Screen: NEGATIVE

## 2016-06-21 SURGERY — RADIO FREQUENCY ABLATION
Anesthesia: General | Laterality: Right

## 2016-06-21 MED ORDER — VALACYCLOVIR HCL 500 MG PO TABS
500.0000 mg | ORAL_TABLET | Freq: Two times a day (BID) | ORAL | Status: DC
  Administered 2016-06-21 – 2016-06-22 (×2): 500 mg via ORAL
  Filled 2016-06-21 (×2): qty 1

## 2016-06-21 MED ORDER — SALINE SPRAY 0.65 % NA SOLN: Freq: Two times a day (BID) | NASAL | Status: DC | PRN

## 2016-06-21 MED ORDER — CEFAZOLIN SODIUM-DEXTROSE 2-4 GM/100ML-% IV SOLN
INTRAVENOUS | Status: AC
  Filled 2016-06-21: qty 100

## 2016-06-21 MED ORDER — IOPAMIDOL (ISOVUE-370) INJECTION 76%
INTRAVENOUS | Status: AC
  Administered 2016-06-21: 50 mL
  Filled 2016-06-21: qty 100

## 2016-06-21 MED ORDER — CEFAZOLIN SODIUM-DEXTROSE 2-4 GM/100ML-% IV SOLN
2.0000 g | INTRAVENOUS | Status: AC
  Administered 2016-06-21: 2 g via INTRAVENOUS

## 2016-06-21 MED ORDER — FENTANYL CITRATE (PF) 100 MCG/2ML IJ SOLN
INTRAMUSCULAR | Status: AC
  Filled 2016-06-21: qty 6

## 2016-06-21 MED ORDER — HYDROCODONE-ACETAMINOPHEN 5-325 MG PO TABS: 1.0000 | ORAL_TABLET | ORAL | Status: DC | PRN

## 2016-06-21 MED ORDER — ATORVASTATIN CALCIUM 40 MG PO TABS
40.0000 mg | ORAL_TABLET | Freq: Every day | ORAL | Status: DC
  Administered 2016-06-21: 40 mg via ORAL
  Filled 2016-06-21: qty 1

## 2016-06-21 MED ORDER — SERTRALINE HCL 50 MG PO TABS
50.0000 mg | ORAL_TABLET | Freq: Two times a day (BID) | ORAL | Status: DC
  Administered 2016-06-22: 50 mg via ORAL
  Filled 2016-06-21: qty 1

## 2016-06-21 MED ORDER — SODIUM CHLORIDE 0.9 % IV SOLN
INTRAVENOUS | Status: AC
  Administered 2016-06-21: 12:00:00 via INTRAVENOUS

## 2016-06-21 MED ORDER — MEMANTINE HCL ER 7 MG PO CP24
21.0000 mg | ORAL_CAPSULE | Freq: Every day | ORAL | Status: DC
  Administered 2016-06-22: 21 mg via ORAL
  Filled 2016-06-21: qty 1

## 2016-06-21 MED ORDER — SODIUM CHLORIDE 0.9 % IV SOLN
INTRAVENOUS | Status: AC
  Filled 2016-06-21: qty 250

## 2016-06-21 MED ORDER — DOCUSATE SODIUM 100 MG PO CAPS
100.0000 mg | ORAL_CAPSULE | Freq: Two times a day (BID) | ORAL | Status: DC
  Administered 2016-06-21 – 2016-06-22 (×2): 100 mg via ORAL
  Filled 2016-06-21 (×5): qty 1

## 2016-06-21 MED ORDER — PROPOFOL 10 MG/ML IV BOLUS
INTRAVENOUS | Status: DC | PRN
  Administered 2016-06-21: 30 mg via INTRAVENOUS
  Administered 2016-06-21 (×2): 50 mg via INTRAVENOUS

## 2016-06-21 MED ORDER — EPHEDRINE SULFATE-NACL 50-0.9 MG/10ML-% IV SOSY
PREFILLED_SYRINGE | INTRAVENOUS | Status: DC | PRN
  Administered 2016-06-21 (×3): 5 mg via INTRAVENOUS
  Administered 2016-06-21 (×2): 10 mg via INTRAVENOUS

## 2016-06-21 MED ORDER — TRAMADOL HCL 50 MG PO TABS
50.0000 mg | ORAL_TABLET | Freq: Four times a day (QID) | ORAL | Status: DC | PRN
  Filled 2016-06-21: qty 1

## 2016-06-21 MED ORDER — SENNOSIDES-DOCUSATE SODIUM 8.6-50 MG PO TABS
1.0000 | ORAL_TABLET | Freq: Every day | ORAL | Status: DC | PRN
  Filled 2016-06-21: qty 1

## 2016-06-21 MED ORDER — ONDANSETRON HCL 4 MG/2ML IJ SOLN: 4.0000 mg | Freq: Four times a day (QID) | INTRAMUSCULAR | Status: DC | PRN

## 2016-06-21 MED ORDER — METOPROLOL SUCCINATE ER 25 MG PO TB24
25.0000 mg | ORAL_TABLET | Freq: Two times a day (BID) | ORAL | Status: DC
  Administered 2016-06-21 – 2016-06-22 (×2): 25 mg via ORAL
  Filled 2016-06-21 (×2): qty 1

## 2016-06-21 MED ORDER — ALPRAZOLAM 0.25 MG PO TABS
0.2500 mg | ORAL_TABLET | Freq: Three times a day (TID) | ORAL | Status: DC | PRN
  Administered 2016-06-21: 0.25 mg via ORAL
  Filled 2016-06-21: qty 2

## 2016-06-21 MED ORDER — ONDANSETRON HCL 4 MG/2ML IJ SOLN
INTRAMUSCULAR | Status: DC | PRN
  Administered 2016-06-21: 4 mg via INTRAVENOUS

## 2016-06-21 MED ORDER — FENTANYL CITRATE (PF) 100 MCG/2ML IJ SOLN: 25.0000 ug | INTRAMUSCULAR | Status: DC | PRN

## 2016-06-21 MED ORDER — MEMANTINE HCL ER 21 MG PO CP24: 21.0000 mg | ORAL_CAPSULE | Freq: Every day | ORAL | Status: DC

## 2016-06-21 MED ORDER — ONDANSETRON HCL 4 MG/2ML IJ SOLN: 4.0000 mg | Freq: Once | INTRAMUSCULAR | Status: DC | PRN

## 2016-06-21 MED ORDER — IOPAMIDOL (ISOVUE-300) INJECTION 61%
INTRAVENOUS | Status: AC
  Filled 2016-06-21: qty 30

## 2016-06-21 MED ORDER — FENTANYL CITRATE (PF) 100 MCG/2ML IJ SOLN
INTRAMUSCULAR | Status: DC | PRN
Start: 2016-06-21 — End: 2016-06-21
  Administered 2016-06-21: 75 ug via INTRAVENOUS
  Administered 2016-06-21: 25 ug via INTRAVENOUS

## 2016-06-21 MED ORDER — ROCURONIUM BROMIDE 10 MG/ML (PF) SYRINGE
PREFILLED_SYRINGE | INTRAVENOUS | Status: DC | PRN
  Administered 2016-06-21: 10 mg via INTRAVENOUS
  Administered 2016-06-21: 50 mg via INTRAVENOUS
  Administered 2016-06-21: 10 mg via INTRAVENOUS

## 2016-06-21 MED ORDER — LACTATED RINGERS IV SOLN
INTRAVENOUS | Status: DC
  Administered 2016-06-21: 08:00:00 via INTRAVENOUS

## 2016-06-21 MED ORDER — LIDOCAINE 2% (20 MG/ML) 5 ML SYRINGE
INTRAMUSCULAR | Status: DC | PRN
  Administered 2016-06-21: 100 mg via INTRAVENOUS

## 2016-06-21 MED ORDER — NITROGLYCERIN 0.4 MG SL SUBL: 0.4000 mg | SUBLINGUAL_TABLET | SUBLINGUAL | Status: DC | PRN

## 2016-06-21 MED ORDER — IOPAMIDOL (ISOVUE-370) INJECTION 76%: 100.0000 mL | Freq: Once | INTRAVENOUS | Status: DC | PRN

## 2016-06-21 MED ORDER — SUGAMMADEX SODIUM 200 MG/2ML IV SOLN
INTRAVENOUS | Status: DC | PRN
  Administered 2016-06-21: 200 mg via INTRAVENOUS

## 2016-06-21 NOTE — H&P (Signed)
Chief Complaint: Recurrent Renal Cell Carcinoma  Referring Physician(s): Jonette Eva  Supervising Physician: Aletta Edouard  Patient Status: [facility] - Out-pt  History of Present Illness: Kenneth Todd is a 83 y.o. male history of imaging evidence of a 2.6 cm solid, enhancing right renal mass emanating from the lateral interpolar kidney.   This mass was initially incidentally detected by CT on 09/16/2015 at [facility] during the workup of biliary sepsis and choledocholithiasis requiring ERCP with stone extraction.   Further characterization was performed with MRI of the abdomen on 10/28/2015 at [facility].   This demonstrated a 2.6 cm solid mass demonstrating enhancement and consistent with probable renal carcinoma.   This is largely an endophytic lesion with minimal protrusion into the central sinus fat and slight exophytic contour bulge. No evidence of regional metastatic disease or renal vein involvement by MRI.   He underwent cryoablation by Dr. Kathlene Cote on 01/25/2016.  He is doing well from the cryo standpoint. He only complains of losing his hearing and his eye sight is deteriorating.  CT scan done at an outside facility reveals small residual tumor.  He is here today for repeat Ablation procedure  He is NPO. No blood thinners.  Past Medical History:  Diagnosis Date  . Complication of anesthesia    wife reports after sphincterotomy in 09/2015 with general anethesia pt had some problems catching breath on awakening. also mentions he "the may have had problems putting him to sleep, i dont remember"   . Coronary artery disease   . Dementia   . Depression   . GERD (gastroesophageal reflux disease)   . Heart disease   . Hyperlipidemia   . Kidney stone   . Pneumonia    aspiration   . Prostate cancer ([place])    prosate cancer  . Right renal mass   . Sleep apnea   . Stroke [facility])     Past Surgical History:  Procedure Laterality Date  . BACK  SURGERY    . CARDIAC CATHETERIZATION  08/07/2015  . CERVICAL DISCECTOMY    . CORONARY ARTERY BYPASS GRAFT  2013  . Cryoablation of Right Renal Clear Cell Carcinoma Right 01/25/2016  . CSF SHUNT    . EYE SURGERY    . IR GENERIC HISTORICAL  02/27/2016   IR RADIOLOGIST EVAL & MGMT 02/27/2016 Aletta Edouard, MD [facility] INTERV RAD  . IR GENERIC HISTORICAL  12/07/2015   IR RADIOLOGIST EVAL & MGMT 12/07/2015 Aletta Edouard, MD [facility] INTERV RAD  . IR GENERIC HISTORICAL  05/08/2016   IR RADIOLOGIST EVAL & MGMT 05/08/2016 Aletta Edouard, MD [facility] INTERV RAD  . LITHOTRIPSY    . LUMBAR LAMINECTOMY    . MITRAL VALVE REPAIR    . PARS PLANA VITRECTOMY W/ REPAIR OF MACULAR HOLE    . PROSTATE SURGERY    . RETINAL DETACHMENT REPAIR W/ SCLERAL BUCKLE LE    . SKIN GRAFT      Allergies: Celecoxib; Cetirizine & related; Ezetimibe; Naproxen; Trazodone and nefazodone; Hydrochlorothiazide; and Triamcinolone acetonide  Medications: Prior to Admission medications   Medication Sig Start Date End Date Taking? Authorizing Provider  acetaminophen (TYLENOL) 500 MG tablet Take 500-1,000 mg by mouth every 6 (six) hours as needed (for pain/headache.).     Historical Provider, MD  ALPRAZolam Duanne Moron) 0.5 MG tablet Take 0.25-0.5 mg by mouth 3 (three) times daily as needed for anxiety.     Historical Provider, MD  amoxicillin (AMOXIL) 500 MG capsule Take 2,000 mg  by mouth See admin instructions. Take 4 capsules (2000 mg) by mouth prior to procedure. 03/20/16   Historical Provider, MD  atorvastatin (LIPITOR) 40 MG tablet Take 40 mg by mouth every evening.     Historical Provider, MD  cholecalciferol (VITAMIN D) 1000 units tablet Take 1,000 Units by mouth every evening.     Historical Provider, MD  co-enzyme Q-10 30 MG capsule Take 30 mg by mouth every evening.     Historical Provider, MD  Cyanocobalamin (VITAMIN B 12 PO) Take 1 tablet by mouth every evening.     Historical Provider, MD  diphenhydrAMINE (BENADRYL) 25 mg  capsule Take 25 mg by mouth every 6 (six) hours as needed (for allergic reactions/rash).     Historical Provider, MD  HYDROcodone-acetaminophen (NORCO) 7.5-325 MG tablet Take 1 tablet by mouth 2 (two) times daily as needed. For pain. 03/25/16   Historical Provider, MD  Memantine HCl ER (NAMENDA XR) 21 MG CP24 Take 21 mg by mouth daily.    Historical Provider, MD  metoprolol succinate (TOPROL-XL) 25 MG 24 hr tablet Take by mouth 2 (two) times daily.     Historical Provider, MD  Multiple Vitamin (MULTIVITAMIN WITH MINERALS) TABS tablet Take 1 tablet by mouth daily.    Historical Provider, MD  nitroGLYCERIN (NITROSTAT) 0.4 MG SL tablet Place 0.4 mg under the tongue every 5 (five) minutes as needed for chest pain.    Historical Provider, MD  sertraline (ZOLOFT) 100 MG tablet Take 50-100 mg by mouth 2 (two) times daily. Various based on patient need per patient spouse    Historical Provider, MD  SIMPLY SALINE NA Place 1-2 sprays into the nose 2 (two) times daily as needed (for nasal congestion).    Historical Provider, MD  traMADol (ULTRAM) 50 MG tablet Take 50 mg by mouth 4 (four) times daily as needed (for pain.).     Historical Provider, MD  valACYclovir (VALTREX) 500 MG tablet Take 500 mg by mouth 3 (three) times daily as needed (for eye virus (typically scheduled twice daily)). (Typically take twice daily)    Historical Provider, MD     No family history on file.  Social History   Social History  . Marital status: Married    Spouse name: N/A  . Number of children: N/A  . Years of education: N/A   Social History Main Topics  . Smoking status: Former Smoker    Packs/day: 1.00    Years: 26.00    Types: Cigarettes    Start date: 12/06/1948    Quit date: 12/07/1974  . Smokeless tobacco: Never Used  . Alcohol use No     Comment: stopped about 7 yrs ago    . Drug use: Unknown  . Sexual activity: Not on file   Other Topics Concern  . Not on file   Social History Narrative  . No narrative  on file    Review of Systems: A 12 point ROS discussed  Review of Systems Review of Systems  Constitutional: Negative.   HENT: Positive for hearing loss.   Eyes: Positive for visual disturbance.  Respiratory: Negative.   Cardiovascular: Negative.   Gastrointestinal: Negative.   Genitourinary: Negative.   Musculoskeletal: Negative.   Skin: Negative.   Neurological: Negative.   Hematological: Negative.   Psychiatric/Behavioral: Negative.    Vital Signs: There were no vitals taken for this visit.  Physical Exam Constitutional: He is oriented to person, place, and time. He appears well-developed and well-nourished.  HENT:  Head:  Normocephalic and atraumatic.  Eyes: EOM are normal.  Neck: Normal range of motion.  Cardiovascular: Normal rate, regular rhythm and normal heart sounds.   No murmur heard. Pulmonary/Chest: Effort normal and breath sounds normal. No respiratory distress. He has no wheezes.  Abdominal: Soft. He exhibits no distension. There is no tenderness.  Musculoskeletal: Normal range of motion.  Neurological: He is alert and oriented to person, place, and time.  Skin: Skin is warm and dry.  Psychiatric: He has a normal mood and affect. His behavior is normal. Judgment and thought content normal.    Mallampati Score:   Done by anesthesia  Imaging: No results found.  Labs:  CBC:  Recent Labs  06/18/16 1425  WBC 6.3  HGB 14.7  HCT 43.9  PLT 142*    COAGS:  Recent Labs  06/18/16 1425  INR 1.01    BMP:  Recent Labs  02/21/16 1430 06/18/16 1425  NA  --  141  K  --  4.9  CL  --  109  CO2  --  26  GLUCOSE  --  89  BUN 23 19  CALCIUM  --  9.4  CREATININE 0.98 1.13  GFRNONAA 71 58*  GFRAA 82 >60    LIVER FUNCTION TESTS: No results for input(s): BILITOT, AST, ALT, ALKPHOS, PROT, ALBUMIN in the last 8760 hours.  TUMOR MARKERS: No results for input(s): AFPTM, CEA, CA199, CHROMGRNA in the last 8760 hours.  Assessment and  Plan:  History of a 2.6 cm solid mass demonstrating enhancement and consistent with probable renal carcinoma.   Cryoablation by Dr. Kathlene Cote on 01/25/2016.  CT scan done at an outside facility reveals small residual tumor.  Will proceed with ablation procedure today.  Risks and Benefits discussed with the patient including, but not limited to failure to treat entire lesion, bleeding, infection, damage to adjacent structures, decrease in renal function or post procedural neuropathy.  All of the patient's questions were answered, patient is agreeable to proceed.  Thank you for this interesting consult.  I greatly enjoyed meeting BROGHAN PANNONE and look forward to participating in their care.  A copy of this report was sent to the requesting provider on this date.  Electronically Signed: Murrell Redden PA-C 06/21/2016, 8:27 AM   I spent a total of   25 Minutes in face to face in clinical consultation, greater than 50% of which was counseling/coordinating care for ablation of renal cell cancer.

## 2016-06-21 NOTE — Transfer of Care (Signed)
Immediate Anesthesia Transfer of Care Note  Patient: Kenneth Todd  Procedure(s) Performed: Procedure(s): RIGHT RENAL CRYO ABLATION (Right)  Patient Location: PACU  Anesthesia Type:General  Level of Consciousness: awake and responds to stimulation  Airway & Oxygen Therapy: Patient Spontanous Breathing and Patient connected to face mask oxygen  Post-op Assessment: Report given to RN and Post -op Vital signs reviewed and stable  Post vital signs: Reviewed and stable  Last Vitals: There were no vitals filed for this visit.  Last Pain: There were no vitals filed for this visit.       Complications: No apparent anesthesia complications

## 2016-06-21 NOTE — H&P (Deleted)
  The note originally documented on this encounter has been moved the the encounter in which it belongs.  

## 2016-06-21 NOTE — Procedures (Signed)
Interventional Radiology Procedure Note  Procedure:  CT guided cryoablation of right renal carcinoma  Anesthesia:  General  Complications: None  Estimated Blood Loss: < 10 mL  Contrast:  50 mL Isovue 370 IV  Single Ice Pearl CX probe used to treat right RCC recurrence.  For overnight observation after PACU recovery.  Venetia Night. Kathlene Cote, M.D Pager:  [phone]

## 2016-06-21 NOTE — Anesthesia Postprocedure Evaluation (Addendum)
Anesthesia Post Note  Patient: DEWAYNE SEVERE  Procedure(s) Performed: Procedure(s) (LRB): RIGHT RENAL CRYO ABLATION (Right)  Patient location during evaluation: PACU Anesthesia Type: General Level of consciousness: awake and alert Pain management: pain level controlled Vital Signs Assessment: post-procedure vital signs reviewed and stable Respiratory status: spontaneous breathing, nonlabored ventilation, respiratory function stable and patient connected to nasal cannula oxygen Cardiovascular status: blood pressure returned to baseline and stable Postop Assessment: no signs of nausea or vomiting Anesthetic complications: no       Last Vitals:  Vitals:   06/21/16 1200 06/21/16 1215  BP: (!) 148/71 (!) 161/83  Pulse: (!) 57 (!) 55  Resp: 17 16  Temp:  36.4 C    Last Pain:  Vitals:   06/21/16 1215  TempSrc: Oral                 Catalina Gravel

## 2016-06-21 NOTE — Progress Notes (Signed)
  Post -op Afternoon Rounds  Patient seen and doing well. Wife at bedside. Reports no issues.  Ate 100% of his lunch. No N/V.  Pain controlled  Urine clear  Foley to be D/C'd around 5 pm  Probable D/C in am.  [facility] S Makinze Jani PA-C 06/21/2016 3:12 PM

## 2016-06-21 NOTE — Anesthesia Preprocedure Evaluation (Addendum)
Anesthesia Evaluation  Patient identified by MRN, date of birth, ID band Patient awake    Reviewed: Allergy & Precautions, NPO status , Patient's Chart, lab work & pertinent test results, reviewed documented beta blocker date and time   Airway Mallampati: II  TM Distance: >3 FB Neck ROM: Limited    Dental  (+) Teeth Intact, Dental Advisory Given   Pulmonary sleep apnea , former smoker,    Pulmonary exam normal breath sounds clear to auscultation       Cardiovascular hypertension, Pt. on home beta blockers + CAD, + Past MI and + CABG (2013)  Normal cardiovascular exam Rhythm:Regular Rate:Normal  S/p MV Repair 2013  Cath 4/17: Grafts x4 patent, EF 45-50%   Neuro/Psych PSYCHIATRIC DISORDERS Depression Dementia Hydrocephalus s/p VP shunt S/p ACDF CVA, Residual Symptoms    GI/Hepatic Neg liver ROS, GERD  ,  Endo/Other  negative endocrine ROS  Renal/GU Renal disease (right renal mass)   Prostate cancer    Musculoskeletal negative musculoskeletal ROS (+)   Abdominal   Peds  Hematology  (+) Blood dyscrasia (Thrombocytopenia), ,   Anesthesia Other Findings Day of surgery medications reviewed with the patient.  Reproductive/Obstetrics                           Anesthesia Physical Anesthesia Plan  ASA: III  Anesthesia Plan: General   Post-op Pain Management:    Induction: Intravenous  Airway Management Planned: Oral ETT  Additional Equipment:   Intra-op Plan:   Post-operative Plan: Extubation in OR  Informed Consent: I have reviewed the patients History and Physical, chart, labs and discussed the procedure including the risks, benefits and alternatives for the proposed anesthesia with the patient or authorized representative who has indicated his/her understanding and acceptance.   Dental advisory given  Plan Discussed with: CRNA  Anesthesia Plan Comments: (Risks/benefits of  general anesthesia discussed with patient including risk of damage to teeth, lips, gum, and tongue, nausea/vomiting, allergic reactions to medications, and the possibility of heart attack, stroke and death.  All patient questions answered.  Patient wishes to proceed.)        Anesthesia Quick Evaluation

## 2016-06-21 NOTE — Anesthesia Procedure Notes (Signed)
Procedure Name: Intubation Date/Time: 06/21/2016 8:54 AM Performed by: Carleene Cooper A Pre-anesthesia Checklist: Patient identified, Emergency Drugs available, Suction available, Patient being monitored and Timeout performed Patient Re-evaluated:Patient Re-evaluated prior to inductionOxygen Delivery Method: Circle system utilized Preoxygenation: Pre-oxygenation with 100% oxygen Intubation Type: IV induction Ventilation: Mask ventilation without difficulty Laryngoscope Size: Glidescope and 4 Grade View: Grade I Tube type: Parker flex tip Tube size: 7.5 mm Number of attempts: 1 Airway Equipment and Method: Video-laryngoscopy and Stylet Placement Confirmation: ETT inserted through vocal cords under direct vision,  positive ETCO2 and breath sounds checked- equal and bilateral Secured at: 21 cm Tube secured with: Tape Dental Injury: Teeth and Oropharynx as per pre-operative assessment  Comments: Elective Glidescope intubation due to wife's concern about a prior anesthetic. She stated Anesthesia may have had difficulty with the intubation. Pt has a had an anterior cervical and noted to have decreased neck range of motion.

## 2016-06-22 ENCOUNTER — Other Ambulatory Visit: Payer: Self-pay | Admitting: Radiology

## 2016-06-22 DIAGNOSIS — C641 Malignant neoplasm of right kidney, except renal pelvis: Secondary | ICD-10-CM | POA: Diagnosis not present

## 2016-06-22 LAB — BASIC METABOLIC PANEL
Anion gap: 5 (ref 5–15)
BUN: 14 mg/dL (ref 6–20)
CHLORIDE: 108 mmol/L (ref 101–111)
CO2: 25 mmol/L (ref 22–32)
Calcium: 8.5 mg/dL — ABNORMAL LOW (ref 8.9–10.3)
Creatinine, Ser: 0.96 mg/dL (ref 0.61–1.24)
GFR calc Af Amer: >60 mL/min (ref >60)
GFR calc non Af Amer: >60 mL/min (ref >60)
Glucose, Bld: 111 mg/dL — ABNORMAL HIGH (ref 65–99)
POTASSIUM: 3.9 mmol/L (ref 3.5–5.1)
SODIUM: 138 mmol/L (ref 135–145)

## 2016-06-22 LAB — CBC
HCT: 41.5 % (ref 39.0–52.0)
Hemoglobin: 13.9 g/dL (ref 13.0–17.0)
MCH: 30.7 pg (ref 26.0–34.0)
MCHC: 33.5 g/dL (ref 30.0–36.0)
MCV: 91.6 fL (ref 78.0–100.0)
Platelets: 130 10*3/uL — ABNORMAL LOW (ref 150–400)
RBC: 4.53 MIL/uL (ref 4.22–5.81)
RDW: 12.4 % (ref 11.5–15.5)
WBC: 8.2 10*3/uL (ref 4.0–10.5)

## 2016-06-22 NOTE — Discharge Instructions (Signed)
Cryoablation, Care After This sheet gives you information about how to care for yourself after your procedure. Your health care provider may also give you more specific instructions. If you have problems or questions, contact your health care provider. What can I expect after the procedure? After the procedure, it is common to have:  Soreness around the treatment area.  Mild pain and swelling in the treatment area. Follow these instructions at home: Treatment area care    Follow instructions from your health care provider about how to take care of your incision. Make sure you:  Wash your hands with soap and water before you change your bandage (dressing). If soap and water are not available, use hand sanitizer.  Change your dressing as told by your health care provider.  Leave stitches (sutures) in place. They may need to stay in place for 2 weeks or longer.  Check your treatment area every day for signs of infection. Check for:  More redness, swelling, or pain.  More fluid or blood.  Warmth.  Pus or a bad smell.  Keep the treated area clean, dry, and covered with a dressing until it has healed. Clean the area with soap and water or as told by your health care provider.  You may shower if your health care provider approves. If your bandage gets wet, change it right away. Activity   Follow instructions from your health care provider about any activity limitations.  Do not drive for 24 hours if you received a medicine to help you relax (sedative). General instructions   Take over-the-counter and prescription medicines only as told by your health care provider.  Keep all follow-up visits as told by your health care provider. This is important. Contact a health care provider if:  You do not have a bowel movement for 2 days.  You have nausea or vomiting.  You have more redness, swelling, or pain around your treatment area.  You have more fluid or blood coming from your  treatment area.  Your treatment area feels warm to the touch.  You have pus or a bad smell coming from your treatment area.  You have a fever. Get help right away if:  You have severe pain.  You have trouble swallowing or breathing.  You have severe weakness or dizziness.  You have chest pain or shortness of breath. This information is not intended to replace advice given to you by your health care provider. Make sure you discuss any questions you have with your health care provider. Document Released: 01/20/2013 Document Revised: 10/20/2015 Document Reviewed: 08/30/2015 Elsevier Interactive Patient Education  2017 [employer].

## 2016-06-22 NOTE — Progress Notes (Signed)
Pts wife refused to let lab draw her husbands blood this am.

## 2016-06-22 NOTE — Discharge Summary (Signed)
Patient ID: Kenneth Todd MRN: [id number] DOB/AGE: 11/25/1930 83 y.o.  Admit date: 06/21/2016 Discharge date: 06/22/2016  Supervising Physician: Aletta Edouard  Patient Status: [facility] - In-pt  Admission Diagnoses: Right renal cell carcinoma recurrence  Discharge Diagnoses: Right renal cell carcinoma recurrence, status post CT-guided percutaneous core cryoablation on 06/21/16 Active Problems:   Renal carcinoma, right [facility])  Past Medical History:  Diagnosis Date  . Complication of anesthesia    wife reports after sphincterotomy in 09/2015 with general anethesia pt had some problems catching breath on awakening. also mentions he "the may have had problems putting him to sleep, i dont remember"   . Coronary artery disease   . Dementia   . Depression   . GERD (gastroesophageal reflux disease)   . Heart disease   . Hyperlipidemia   . Kidney stone   . Pneumonia    aspiration   . Prostate cancer ([place])    prosate cancer  . Right renal mass   . Sleep apnea   . Stroke [facility])    Past Surgical History:  Procedure Laterality Date  . BACK SURGERY    . CARDIAC CATHETERIZATION  08/07/2015  . CERVICAL DISCECTOMY    . CORONARY ARTERY BYPASS GRAFT  2013  . Cryoablation of Right Renal Clear Cell Carcinoma Right 01/25/2016  . CSF SHUNT    . EYE SURGERY    . IR GENERIC HISTORICAL  02/27/2016   IR RADIOLOGIST EVAL & MGMT 02/27/2016 Aletta Edouard, MD [facility] INTERV RAD  . IR GENERIC HISTORICAL  12/07/2015   IR RADIOLOGIST EVAL & MGMT 12/07/2015 Aletta Edouard, MD [facility] INTERV RAD  . IR GENERIC HISTORICAL  05/08/2016   IR RADIOLOGIST EVAL & MGMT 05/08/2016 Aletta Edouard, MD [facility] INTERV RAD  . LITHOTRIPSY    . LUMBAR LAMINECTOMY    . MITRAL VALVE REPAIR    . PARS PLANA VITRECTOMY W/ REPAIR OF MACULAR HOLE    . PROSTATE SURGERY    . RETINAL DETACHMENT REPAIR W/ SCLERAL BUCKLE LE    . SKIN GRAFT       Discharged Condition: good  Hospital Course: Kenneth Todd is a 83 year old white male  who is status post percutaneous cryoablation of a biopsy-proven clear cell right renal carcinoma on 01/25/16. Since the procedure there has been development of an enhancing nodular recurrence along the inferior and anterior margin of the prior cryoablation site measuring approximately 1.6 cm in maximal diameter by prior CT. Following recent evaluation by Dr. Kathlene Cote he was deemed an appropriate candidate for repeat cryoablation of this renal carcinoma recurrence. On 06/21/16 underwent CT-guided percutaneous core cryoablation of the right renal carcinoma recurrence via general anesthesia. He tolerated the procedure well and was admitted for overnight observation. On the day of discharge the patient was stable. He was able to void and tolerate his diet without difficulty. Labs were stable. He denied significant right flank pain, dyspnea or chest pain. He was a little slow to recover from general anesthesia with some episodes of forgetfulness which has occurred in the past. He was deemed stable for discharge following d/w Dr. Kathlene Cote.  He will be scheduled for follow-up with Dr. Kathlene Cote in the [place] clinic in 4 weeks. He will resume his usual home medications.  Consults: none  Significant Diagnostic Studies: Results for orders placed or performed during the hospital encounter of 29/51/88  Basic metabolic panel  Result Value Ref Range   Sodium 138 135 - 145 mmol/L   Potassium 3.9 3.5 - 5.1 mmol/L  Chloride 108 101 - 111 mmol/L   CO2 25 22 - 32 mmol/L   Glucose, Bld 111 (H) 65 - 99 mg/dL   BUN 14 6 - 20 mg/dL   Creatinine, Ser 0.96 0.61 - 1.24 mg/dL   Calcium 8.5 (L) 8.9 - 10.3 mg/dL   GFR calc non Af Amer >60 >60 mL/min   GFR calc Af Amer >60 >60 mL/min   Anion gap 5 5 - 15  CBC  Result Value Ref Range   WBC 8.2 4.0 - 10.5 K/uL   RBC 4.53 4.22 - 5.81 MIL/uL   Hemoglobin 13.9 13.0 - 17.0 g/dL   HCT 41.5 39.0 - 52.0 %   MCV 91.6 78.0 - 100.0 fL   MCH 30.7 26.0 - 34.0 pg   MCHC 33.5 30.0 - 36.0  g/dL   RDW 12.4 11.5 - 15.5 %   Platelets 130 (L) 150 - 400 K/uL     Treatments: Successful CT-guided percutaneous core cryoablation of right renal carcinoma recurrence via general anesthesia on 06/21/16  Discharge Exam: Blood pressure 135/64, pulse (!) 57, temperature 97.5 F (36.4 C), temperature source Oral, resp. rate 18, height 5\' 10"  (1.778 m), weight 200 lb (90.7 kg), SpO2 96 %. Awake, alert. Chest clear to auscultation bilaterally. Heart with slightly bradycardic but regular rhythm. Abdomen soft, positive bowel sounds, nontender; puncture site right flank clean, dry, minimal tenderness, no significant hematoma. Lower extremities -no significant edema.  Disposition: home  Discharge Instructions    Call MD for:  difficulty breathing, headache or visual disturbances    Complete by:  As directed    Call MD for:  extreme fatigue    Complete by:  As directed    Call MD for:  hives    Complete by:  As directed    Call MD for:  persistant dizziness or light-headedness    Complete by:  As directed    Call MD for:  persistant nausea and vomiting    Complete by:  As directed    Call MD for:  redness, tenderness, or signs of infection (pain, swelling, redness, odor or green/yellow discharge around incision site)    Complete by:  As directed    Call MD for:  severe uncontrolled pain    Complete by:  As directed    Call MD for:  temperature >100.4    Complete by:  As directed    Diet - low sodium heart healthy    Complete by:  As directed    Driving Restrictions    Complete by:  As directed    No driving for 48 hours   Increase activity slowly    Complete by:  As directed    Lifting restrictions    Complete by:  As directed    No heavy lifting for 3-4 days   May shower / Bathe    Complete by:  As directed    May walk up steps    Complete by:  As directed      Allergies as of 06/22/2016      Reactions   Celecoxib Nausea Only   Cetirizine & Related Other (See Comments)    Swelling of tongue   Ezetimibe Other (See Comments)   Fatigue/sleepy   Naproxen Other (See Comments)   GI upset   Trazodone And Nefazodone Other (See Comments)   Unaware of this allergy or unsure this medication.   Hydrochlorothiazide Palpitations   Triamcinolone Acetonide Rash   Redness, flush, increased BP (Kenalog  Steroid injection)      Medication List    TAKE these medications   acetaminophen 500 MG tablet Commonly known as:  TYLENOL Take 500-1,000 mg by mouth every 6 (six) hours as needed (for pain/headache.).   ALPRAZolam 0.5 MG tablet Commonly known as:  XANAX Take 0.25-0.5 mg by mouth 3 (three) times daily as needed for anxiety.   amoxicillin 500 MG capsule Commonly known as:  AMOXIL Take 2,000 mg by mouth See admin instructions. Take 4 capsules (2000 mg) by mouth prior to procedure.   atorvastatin 40 MG tablet Commonly known as:  LIPITOR Take 40 mg by mouth every evening.   cholecalciferol 1000 units tablet Commonly known as:  VITAMIN D Take 1,000 Units by mouth every evening.   co-enzyme Q-10 30 MG capsule Take 30 mg by mouth every evening.   diphenhydrAMINE 25 mg capsule Commonly known as:  BENADRYL Take 25 mg by mouth every 6 (six) hours as needed (for allergic reactions/rash).   HYDROcodone-acetaminophen 7.5-325 MG tablet Commonly known as:  NORCO Take 1 tablet by mouth 2 (two) times daily as needed. For pain.   metoprolol succinate 25 MG 24 hr tablet Commonly known as:  TOPROL-XL Take by mouth 2 (two) times daily.   multivitamin with minerals Tabs tablet Take 1 tablet by mouth daily.   NAMENDA XR 21 MG Cp24 Generic drug:  Memantine HCl ER Take 21 mg by mouth daily.   nitroGLYCERIN 0.4 MG SL tablet Commonly known as:  NITROSTAT Place 0.4 mg under the tongue every 5 (five) minutes as needed for chest pain.   sertraline 100 MG tablet Commonly known as:  ZOLOFT Take 50-100 mg by mouth 2 (two) times daily. Various based on patient need per  patient spouse   SIMPLY SALINE NA Place 1-2 sprays into the nose 2 (two) times daily as needed (for nasal congestion).   traMADol 50 MG tablet Commonly known as:  ULTRAM Take 50 mg by mouth 4 (four) times daily as needed (for pain.).   valACYclovir 500 MG tablet Commonly known as:  VALTREX Take 500 mg by mouth 3 (three) times daily as needed (for eye virus (typically scheduled twice daily)). (Typically take twice daily)   VITAMIN B 12 PO Take 1 tablet by mouth every evening.      Follow-up Information    YAMAGATA,GLENN T, MD Follow up.   Specialty:  Interventional Radiology Why:  Radiology will call you with follow up appointment with Dr. Kathlene Cote in the [place] clinic in 4 weeks. Call [phone] or [phone] with any questions. Contact information: [address] [place] [address] [phone]        Pamala Hurry, MD Follow up.   Specialty:  Urology Why:  follow up with Dr. Nevada Crane as scheduled Contact information: [address] [place] [address] [phone]            Electronically Signed: D. Rowe Robert 06/22/2016, 8:21 AM   I have spent less than 30 minutes discharging Kenneth Todd.

## 2016-07-04 ENCOUNTER — Encounter (HOSPITAL_COMMUNITY): Payer: Self-pay | Admitting: Interventional Radiology

## 2016-07-17 ENCOUNTER — Ambulatory Visit
Admission: RE | Admit: 2016-07-17 | Discharge: 2016-07-17 | Disposition: A | Discharge status: Home / Self Care | Payer: Medicare Other | Source: Ambulatory Visit | Attending: Radiology | Admitting: Radiology

## 2016-07-17 DIAGNOSIS — C641 Malignant neoplasm of right kidney, except renal pelvis: Secondary | ICD-10-CM

## 2016-07-17 HISTORY — PX: IR RADIOLOGIST EVAL & MGMT: IMG5224

## 2016-07-17 NOTE — Progress Notes (Signed)
Chief Complaint: Status post percutaneous cryoablation of recurrence of a right renal carcinoma on 06/21/2016.  History of Present Illness: Kenneth Todd is a 83 y.o. 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 Follow-up imaging demonstrated hypervascular nodular enhancement along the inferior and anterior border of prior ablation. This nodular area of recurrence was treated with cryoablation on 06/21/2016. The procedure was well-tolerated. Kenneth Todd and his wife state that he did have some lethargy and fatigue for about 2 weeks after the procedure. He has no urinary complaints or abdominal pain.  Past Medical History:  Diagnosis Date  . Complication of anesthesia    wife reports after sphincterotomy in 09/2015 with general anethesia pt had some problems catching breath on awakening. also mentions he "the may have had problems putting him to sleep, i dont remember"   . Coronary artery disease   . Dementia   . Depression   . GERD (gastroesophageal reflux disease)   . Heart disease   . Hyperlipidemia   . Kidney stone   . Pneumonia    aspiration   . Prostate cancer ([place])    prosate cancer  . Right renal mass   . Sleep apnea   . Stroke [facility])     Past Surgical History:  Procedure Laterality Date  . BACK SURGERY    . CARDIAC CATHETERIZATION  08/07/2015  . CERVICAL DISCECTOMY    . CORONARY ARTERY BYPASS GRAFT  2013  . Cryoablation of Right Renal Clear Cell Carcinoma Right 01/25/2016  . CSF SHUNT    . EYE SURGERY    . IR GENERIC HISTORICAL  02/27/2016   IR RADIOLOGIST EVAL & MGMT 02/27/2016 Aletta Edouard, MD [facility] INTERV RAD  . IR GENERIC HISTORICAL  12/07/2015   IR RADIOLOGIST EVAL & MGMT 12/07/2015 Aletta Edouard, MD [facility] INTERV RAD  . IR GENERIC HISTORICAL  05/08/2016   IR RADIOLOGIST EVAL & MGMT 05/08/2016 Aletta Edouard, MD [facility] INTERV RAD  . LITHOTRIPSY    . LUMBAR LAMINECTOMY    . MITRAL  VALVE REPAIR    . PARS PLANA VITRECTOMY W/ REPAIR OF MACULAR HOLE    . PROSTATE SURGERY    . RADIOFREQUENCY ABLATION Right 06/21/2016   Procedure: RIGHT RENAL CRYO ABLATION;  Surgeon: Aletta Edouard, MD;  Location: [facility];  Service: Anesthesiology;  Laterality: Right;  . RETINAL DETACHMENT REPAIR W/ SCLERAL BUCKLE LE    . SKIN GRAFT      Allergies: Celecoxib; Cetirizine & related; Ezetimibe; Naproxen; Trazodone and nefazodone; Hydrochlorothiazide; and Triamcinolone acetonide  Medications: Prior to Admission medications   Medication Sig Start Date End Date Taking? Authorizing Provider  acetaminophen (TYLENOL) 500 MG tablet Take 500-1,000 mg by mouth every 6 (six) hours as needed (for pain/headache.).    Yes Historical Provider, MD  ALPRAZolam Duanne Moron) 0.5 MG tablet Take 0.25-0.5 mg by mouth 3 (three) times daily as needed for anxiety.    Yes Historical Provider, MD  atorvastatin (LIPITOR) 40 MG tablet Take 40 mg by mouth every evening.    Yes Historical Provider, MD  cholecalciferol (VITAMIN D) 1000 units tablet Take 1,000 Units by mouth every evening.    Yes Historical Provider, MD  co-enzyme Q-10 30 MG capsule Take 30 mg by mouth every evening.    Yes Historical Provider, MD  Cyanocobalamin (VITAMIN B 12 PO) Take 1 tablet by mouth every evening.    Yes Historical Provider, MD  diphenhydrAMINE (BENADRYL) 25 mg capsule Take  25 mg by mouth every 6 (six) hours as needed (for allergic reactions/rash).    Yes Historical Provider, MD  HYDROcodone-acetaminophen (NORCO) 7.5-325 MG tablet Take 1 tablet by mouth 2 (two) times daily as needed. For pain. 03/25/16  Yes Historical Provider, MD  Memantine HCl ER (NAMENDA XR) 21 MG CP24 Take 21 mg by mouth daily.   Yes Historical Provider, MD  metoprolol succinate (TOPROL-XL) 25 MG 24 hr tablet Take by mouth 2 (two) times daily.    Yes Historical Provider, MD  Multiple Vitamin (MULTIVITAMIN WITH MINERALS) TABS tablet Take 1 tablet by mouth daily.   Yes  Historical Provider, MD  nitroGLYCERIN (NITROSTAT) 0.4 MG SL tablet Place 0.4 mg under the tongue every 5 (five) minutes as needed for chest pain.   Yes Historical Provider, MD  sertraline (ZOLOFT) 100 MG tablet Take 50-100 mg by mouth 2 (two) times daily. Various based on patient need per patient spouse   Yes Historical Provider, MD  traMADol (ULTRAM) 50 MG tablet Take 50 mg by mouth 4 (four) times daily as needed (for pain.).    Yes Historical Provider, MD  valACYclovir (VALTREX) 500 MG tablet Take 500 mg by mouth 3 (three) times daily as needed (for eye virus (typically scheduled twice daily)). (Typically take twice daily)   Yes Historical Provider, MD  amoxicillin (AMOXIL) 500 MG capsule Take 2,000 mg by mouth See admin instructions. Take 4 capsules (2000 mg) by mouth prior to procedure. 03/20/16   Historical Provider, MD  SIMPLY SALINE NA Place 1-2 sprays into the nose 2 (two) times daily as needed (for nasal congestion).    Historical Provider, MD     No family history on file.  Social History   Social History  . Marital status: Married    Spouse name: N/A  . Number of children: N/A  . Years of education: N/A   Social History Main Topics  . Smoking status: Former Smoker    Packs/day: 1.00    Years: 26.00    Types: Cigarettes    Start date: 12/06/1948    Quit date: 12/07/1974  . Smokeless tobacco: Never Used  . Alcohol use No     Comment: stopped about 7 yrs ago    . Drug use: Unknown  . Sexual activity: Not on file   Other Topics Concern  . Not on file   Social History Narrative  . No narrative on file    ECOG Status: 0 - Asymptomatic  Review of Systems: A 12 point ROS discussed and pertinent positives are indicated in the HPI above.  All other systems are negative.  Review of Systems  Constitutional: Positive for fatigue.  Respiratory: Negative.   Cardiovascular: Negative.   Gastrointestinal: Negative.   Genitourinary: Negative.   Musculoskeletal: Negative.     Neurological: Negative.     Vital Signs: BP 136/66 (BP Location: Left Arm, Patient Position: Sitting, Cuff Size: Large)   Pulse (!) 55   Temp 97.8 F (36.6 C) (Oral)   Resp 15   Ht 5\' 11"  (1.803 m)   Wt 195 lb (88.5 kg)   SpO2 97%   BMI 27.20 kg/m   Physical Exam  Constitutional: No distress.  Abdominal: Soft. He exhibits no distension. There is no tenderness. There is no rebound and no guarding.  Skin: He is not diaphoretic.    Imaging: Ct Guide Tissue Ablation  Result Date: 06/21/2016 CLINICAL DATA:  Status post percutaneous cryoablation of a biopsy-proven clear cell right renal carcinoma on  01/25/2016. There has been development of an enhancing nodular recurrence along the inferior and anterior margin of prior cryoablation measuring approximately 1.6 cm in maximal diameter by prior CT. He now presents for cryoablation of renal carcinoma recurrence. EXAM: CT-GUIDED PERCUTANEOUS CRYOABLATION OF RIGHT RENAL CARCINOMA ANESTHESIA/SEDATION: General MEDICATIONS: 2 g IV Ancef. The antibiotic was administered in an appropriate time interval prior to needle puncture of the skin. CONTRAST:  50 mL Isovue 370 IV PROCEDURE: The procedure, risks, benefits, and alternatives were explained to the patient. Questions regarding the procedure were encouraged and answered. The patient understands and consents to the procedure. The patient was placed under general anesthesia. Initial unenhanced CT was performed in a prone position to localize the right kidney. The right flank region was prepped with chlorhexidine in a sterile fashion, and a sterile drape was applied covering the operative field. A sterile gown and sterile gloves were used for the procedure. Contrast enhanced CT was performed of the kidneys after administration of IV contrast. Under CT guidance, a [employer] CX percutaneous cryoablation probe was advanced into the right renal tumor. Probe positioning was confirmed by CT prior to  cryoablation. Cryoablation was performed through the cryoablation probe. Initial 10 minute cycle of cryoablation was performed. This was followed by a 6 minute thaw cycle. A second 10 minute cycle of cryoablation was then performed. During ablation, periodic CT imaging was performed to monitor ice ball formation and morphology. After thaw, the cryoablation probe was removed. COMPLICATIONS: None FINDINGS: After contrast administration, there is again evidence of a nodular focus of arterial phase contrast enhancement measuring approximately 1.7 cm in greatest diameter along the inferior, deep margin of prior cryoablation. During cryoablation with a single probe, there is good visualization of an ice ball that encompasses the region of nodular enhancement. IMPRESSION: CT guided percutaneous core cryoablation of right renal carcinoma recurrence. The patient will be observed overnight. Initial follow-up will be performed in approximately 4 weeks. Electronically Signed   By: Aletta Edouard M.D.   On: 06/21/2016 12:27    Labs:  CBC:  Recent Labs  06/18/16 1425 06/22/16 0843  WBC 6.3 8.2  HGB 14.7 13.9  HCT 43.9 41.5  PLT 142* 130*    COAGS:  Recent Labs  06/18/16 1425  INR 1.01    BMP:  Recent Labs  02/21/16 1430 06/18/16 1425 06/22/16 0843  NA  --  141 138  K  --  4.9 3.9  CL  --  109 108  CO2  --  26 25  GLUCOSE  --  89 111*  BUN 23 19 14   CALCIUM  --  9.4 8.5*  CREATININE 0.98 1.13 0.96  GFRNONAA 71 58* >60  GFRAA 82 >60 >60    Assessment and Plan:  Kenneth Todd is doing well one month after additional percutaneous cryoablation to treat a focus of nodular recurrence at the margin of previous ablation of a right clear cell renal carcinoma. I have recommended a follow-up CT in early June, 3 months after ablation. I will meet with him and his wife to review the CT findings at that time.   Electronically SignedAletta Edouard T 07/17/2016, 1:53 PM   I spent a total of 15  Minutes in face to face in clinical consultation, greater than 50% of which was counseling/coordinating care post ablation of a right renal carcinoma.

## 2016-09-17 ENCOUNTER — Other Ambulatory Visit (HOSPITAL_COMMUNITY): Payer: Self-pay | Admitting: Interventional Radiology

## 2016-09-17 DIAGNOSIS — C641 Malignant neoplasm of right kidney, except renal pelvis: Secondary | ICD-10-CM

## 2016-09-19 ENCOUNTER — Ambulatory Visit
Admission: RE | Admit: 2016-09-19 | Discharge: 2016-09-19 | Disposition: A | Discharge status: Home / Self Care | Payer: Self-pay | Source: Ambulatory Visit | Attending: Interventional Radiology | Admitting: Interventional Radiology

## 2016-09-19 ENCOUNTER — Other Ambulatory Visit (HOSPITAL_COMMUNITY): Payer: Self-pay | Admitting: Interventional Radiology

## 2016-09-19 DIAGNOSIS — C641 Malignant neoplasm of right kidney, except renal pelvis: Secondary | ICD-10-CM

## 2016-09-25 ENCOUNTER — Telehealth: Payer: Self-pay | Admitting: Radiology

## 2016-09-25 ENCOUNTER — Other Ambulatory Visit: Payer: Medicare Other

## 2016-09-25 NOTE — Telephone Encounter (Signed)
Patient's wife phoned with results of recent CT:  "Ablation site looks much better after second treatment.  No kidney stone noted.  Repeat CT in March 2019 at [facility] (with arterial/venous/delayed phases) and follow up office visit with Dr. Kathlene Cote."  Wife is in agreement.  CT disc (from outside facility) mailed to patient at wife's request.  Reece Levy, RN 09/25/2016 9:46 AM

## 2016-09-27 ENCOUNTER — Encounter: Payer: Self-pay | Admitting: Interventional Radiology

## 2017-05-28 ENCOUNTER — Other Ambulatory Visit (HOSPITAL_COMMUNITY): Payer: Self-pay | Admitting: Interventional Radiology

## 2017-05-28 DIAGNOSIS — C641 Malignant neoplasm of right kidney, except renal pelvis: Secondary | ICD-10-CM

## 2017-05-29 ENCOUNTER — Other Ambulatory Visit: Payer: Self-pay | Admitting: *Deleted

## 2017-05-29 DIAGNOSIS — C641 Malignant neoplasm of right kidney, except renal pelvis: Secondary | ICD-10-CM

## 2017-06-11 ENCOUNTER — Encounter: Payer: Self-pay | Admitting: Radiology

## 2017-06-11 ENCOUNTER — Ambulatory Visit (HOSPITAL_COMMUNITY)
Admission: RE | Admit: 2017-06-11 | Discharge: 2017-06-11 | Disposition: A | Discharge status: Home / Self Care | Payer: Medicare Other | Source: Ambulatory Visit | Attending: Interventional Radiology | Admitting: Interventional Radiology

## 2017-06-11 ENCOUNTER — Ambulatory Visit
Admission: RE | Admit: 2017-06-11 | Discharge: 2017-06-11 | Disposition: A | Discharge status: Home / Self Care | Payer: Medicare Other | Source: Ambulatory Visit | Attending: Interventional Radiology | Admitting: Interventional Radiology

## 2017-06-11 DIAGNOSIS — C641 Malignant neoplasm of right kidney, except renal pelvis: Secondary | ICD-10-CM | POA: Insufficient documentation

## 2017-06-11 HISTORY — PX: IR RADIOLOGIST EVAL & MGMT: IMG5224

## 2017-06-11 LAB — POCT I-STAT CREATININE: CREATININE: 1.1 mg/dL (ref 0.61–1.24)

## 2017-06-11 MED ORDER — IOPAMIDOL (ISOVUE-300) INJECTION 61%
100.0000 mL | Freq: Once | INTRAVENOUS | Status: AC | PRN
Start: 2017-06-11 — End: 2017-06-11
  Administered 2017-06-11: 100 mL via INTRAVENOUS

## 2017-06-11 MED ORDER — IOPAMIDOL (ISOVUE-300) INJECTION 61%
INTRAVENOUS | Status: AC
  Filled 2017-06-11: qty 100

## 2017-06-11 NOTE — Progress Notes (Signed)
Referring Physician(s): Dr Heather Roberts  Chief Complaint: The patient is seen in follow up today s/p right renal cell carcinoma cryoablation 01/2016 and 06/2016  History of present illness:  Original right renal cell carcinoma cryoablation 01/2016 Follow-up imaging demonstrated hypervascular nodular enhancement along the inferior and anterior border of prior ablation. This nodular area of recurrence was treated with cryoablation on 06/21/2016 Follow up scan at [place] was performed 09/2016  Now scheduled for follow up imaging and visit with Dr Kathlene Cote  Denies pain ; N/V Minimal wt loss Urinary urgency  Todays scan: IMPRESSION: Status post cryoablation in the lateral right upper kidney. 9 mm focus of arterial enhancement along the anterior/inferior aspect of the cryoablation zone, increased from the prior, worrisome for residual/recurrent neoplasm.   Past Medical History:  Diagnosis Date  . Complication of anesthesia    wife reports after sphincterotomy in 09/2015 with general anethesia pt had some problems catching breath on awakening. also mentions he "the may have had problems putting him to sleep, i dont remember"   . Coronary artery disease   . Dementia   . Depression   . GERD (gastroesophageal reflux disease)   . Heart disease   . Hyperlipidemia   . Kidney stone   . Pneumonia    aspiration   . Prostate cancer ([place])    prosate cancer  . Right renal mass   . Sleep apnea   . Stroke [facility])     Past Surgical History:  Procedure Laterality Date  . BACK SURGERY    . CARDIAC CATHETERIZATION  08/07/2015  . CERVICAL DISCECTOMY    . CORONARY ARTERY BYPASS GRAFT  2013  . Cryoablation of Right Renal Clear Cell Carcinoma Right 01/25/2016  . CSF SHUNT    . EYE SURGERY    . IR GENERIC HISTORICAL  02/27/2016   IR RADIOLOGIST EVAL & MGMT 02/27/2016 Aletta Edouard, MD [facility] INTERV RAD  . IR GENERIC HISTORICAL  12/07/2015   IR RADIOLOGIST EVAL & MGMT 12/07/2015 Aletta Edouard,  MD [facility] INTERV RAD  . IR GENERIC HISTORICAL  05/08/2016   IR RADIOLOGIST EVAL & MGMT 05/08/2016 Aletta Edouard, MD [facility] INTERV RAD  . IR RADIOLOGIST EVAL & MGMT  07/17/2016  . LITHOTRIPSY    . LUMBAR LAMINECTOMY    . MITRAL VALVE REPAIR    . PARS PLANA VITRECTOMY W/ REPAIR OF MACULAR HOLE    . PROSTATE SURGERY    . RADIOFREQUENCY ABLATION Right 06/21/2016   Procedure: RIGHT RENAL CRYO ABLATION;  Surgeon: Aletta Edouard, MD;  Location: [facility];  Service: Anesthesiology;  Laterality: Right;  . RETINAL DETACHMENT REPAIR W/ SCLERAL BUCKLE LE    . SKIN GRAFT      Allergies: Celecoxib; Cetirizine & related; Ezetimibe; Naproxen; Trazodone and nefazodone; Hydrochlorothiazide; and Triamcinolone acetonide  Medications: Prior to Admission medications   Medication Sig Start Date End Date Taking? Authorizing Provider  acetaminophen (TYLENOL) 500 MG tablet Take 500-1,000 mg by mouth every 6 (six) hours as needed (for pain/headache.).    Yes [provider]  ALPRAZolam (XANAX) 0.5 MG tablet Take 0.25-0.5 mg by mouth 3 (three) times daily as needed for anxiety.    Yes [provider]  amoxicillin (AMOXIL) 500 MG capsule Take 2,000 mg by mouth See admin instructions. Take 4 capsules (2000 mg) by mouth prior to procedure. 03/20/16  Yes [provider]  aspirin 81 MG tablet Take 81 mg by mouth daily.   Yes [provider]  atorvastatin (LIPITOR) 40 MG tablet  Take 40 mg by mouth every evening.    Yes [provider]  cholecalciferol (VITAMIN D) 1000 units tablet Take 1,000 Units by mouth every evening.    Yes [provider]  Cyanocobalamin (VITAMIN B 12 PO) Take 1 tablet by mouth every evening.    Yes [provider]  diphenhydrAMINE (BENADRYL) 25 mg capsule Take 25 mg by mouth every 6 (six) hours as needed (for allergic reactions/rash).    Yes [provider]  HYDROcodone-acetaminophen (NORCO) 7.5-325 MG tablet Take 1 tablet by mouth 2  (two) times daily as needed. For pain. 03/25/16  Yes [provider]  Memantine HCl ER (NAMENDA XR) 21 MG CP24 Take 21 mg by mouth daily.   Yes [provider]  metoprolol succinate (TOPROL-XL) 25 MG 24 hr tablet Take by mouth 2 (two) times daily.    Yes [provider]  Multiple Vitamin (MULTIVITAMIN WITH MINERALS) TABS tablet Take 1 tablet by mouth daily.   Yes [provider]  nitroGLYCERIN (NITROSTAT) 0.4 MG SL tablet Place 0.4 mg under the tongue every 5 (five) minutes as needed for chest pain.   Yes [provider]  SIMPLY SALINE NA Place 1-2 sprays into the nose 2 (two) times daily as needed (for nasal congestion).   Yes [provider]  traMADol (ULTRAM) 50 MG tablet Take 50 mg by mouth 4 (four) times daily as needed (for pain.).    Yes [provider]  valACYclovir (VALTREX) 500 MG tablet Take 500 mg by mouth 3 (three) times daily as needed (for eye virus (typically scheduled twice daily)). (Typically take twice daily)   Yes [provider]  co-enzyme Q-10 30 MG capsule Take 30 mg by mouth every evening.     [provider]  sertraline (ZOLOFT) 100 MG tablet Take 50-100 mg by mouth 2 (two) times daily. Various based on patient need per patient spouse    [provider]     No family history on file.  Social History   Socioeconomic History  . Marital status: Married    Spouse name: Not on file  . Number of children: Not on file  . Years of education: Not on file  . Highest education level: Not on file  Social Needs  . Financial resource strain: Not on file  . Food insecurity - worry: Not on file  . Food insecurity - inability: Not on file  . Transportation needs - medical: Not on file  . Transportation needs - non-medical: Not on file  Occupational History  . Not on file  Tobacco Use  . Smoking status: Former Smoker    Packs/day: 1.00    Years: 26.00    Pack years: 26.00    Types:  Cigarettes    Start date: 12/06/1948    Last attempt to quit: 12/07/1974    Years since quitting: 42.5  . Smokeless tobacco: Never Used  Substance and Sexual Activity  . Alcohol use: No    Comment: stopped about 7 yrs ago    . Drug use: Not on file  . Sexual activity: Not on file  Other Topics Concern  . Not on file  Social History Narrative  . Not on file     Vital Signs: BP (!) 141/66   Pulse (!) 56   Temp 97.6 F (36.4 C) (Oral)   Resp 16   Ht 5\' 10"  (1.778 m)   Wt 195 lb (88.5 kg)   SpO2 100%   BMI  27.98 kg/m   Physical Exam  Constitutional: He is oriented to person, place, and time.  Cardiovascular: Normal rate and regular rhythm.  Pulmonary/Chest: Effort normal and breath sounds normal.  Abdominal: Soft. Bowel sounds are normal.  Musculoskeletal: Normal range of motion.  Neurological: He is alert and oriented to person, place, and time.  Skin: Skin is warm and dry.  Psychiatric: He has a normal mood and affect. His behavior is normal.  Nursing note and vitals reviewed.   Imaging: Ct Abdomen W Wo Contrast  Result Date: 06/11/2017 CLINICAL DATA:  Status post renal cryoablation on 10/17 and 3/18 EXAM: CT ABDOMEN WITHOUT AND WITH CONTRAST TECHNIQUE: Multidetector CT imaging of the abdomen was performed following the standard protocol before and following the bolus administration of intravenous contrast. CONTRAST:  137mL ISOVUE-300 IOPAMIDOL (ISOVUE-300) INJECTION 61% COMPARISON:  Outside hospital CT abdomen/pelvis dated 09/13/2016 and 04/30/2016 FINDINGS: Lower chest: Lung bases are clear. Hepatobiliary: Liver is within normal limits. Gallbladder is unremarkable. No intrahepatic or extrahepatic ductal dilatation. Pancreas: Within normal limits. Spleen: Within normal limits. Adrenals/Urinary Tract: Adrenal glands are within normal limits. Status post renal cryoablation in the lateral right upper kidney. Ablation zone now measures approximately 2.9 x 1.4 cm (series 7/image  34). 9 x 9 mm focus of arterial enhancement along the anterior/inferior aspect of the ablation zone (series 4/image 37), previously 5 mm, worrisome for residual/recurrent neoplasm. Bilateral renal cysts, measuring up to 6.7 cm in the anterior interpolar right kidney (series 7/image 41) and 2.7 cm in the medial left lower kidney (series 7/image 46). No hydronephrosis. Stomach/Bowel: Stomach is within normal limits. Visualized bowel is unremarkable. Vascular/Lymphatic: No evidence of abdominal aortic aneurysm. Atherosclerotic calcifications of the abdominal aorta and branch vessels. No suspicious abdominopelvic lymphadenopathy. Other: No abdominal ascites. Catheter wire beneath the left anterior abdominal wall, incompletely visualized. Musculoskeletal: Degenerative changes of the thoracic spine. IMPRESSION: Status post cryoablation in the lateral right upper kidney. 9 mm focus of arterial enhancement along the anterior/inferior aspect of the cryoablation zone, increased from the prior, worrisome for residual/recurrent neoplasm. These results were discussed at the time of interpretation on 06/11/2017 at 12:45 pm with Dr. Aletta Edouard , who verbally acknowledged these results. Electronically Signed   By: Julian Hy M.D.   On: 06/11/2017 12:45    Labs:  CBC: Recent Labs    06/18/16 1425 06/22/16 0843  WBC 6.3 8.2  HGB 14.7 13.9  HCT 43.9 41.5  PLT 142* 130*    COAGS: Recent Labs    06/18/16 1425  INR 1.01    BMP: Recent Labs    06/18/16 1425 06/22/16 0843 06/11/17 1157  NA 141 138  --   K 4.9 3.9  --   CL 109 108  --   CO2 26 25  --   GLUCOSE 89 111*  --   BUN 19 14  --   CALCIUM 9.4 8.5*  --   CREATININE 1.13 0.96 1.10  GFRNONAA 58* >60  --   GFRAA >60 >60  --     LIVER FUNCTION TESTS: No results for input(s): BILITOT, AST, ALT, ALKPHOS, PROT, ALBUMIN in the last 8760 hours.  Assessment:  Dr Kathlene Cote has discussed CT findings from today Plan: CT 6 months-- if shows  area is in fact enlarging; will need to consider thermal ablation at same site. Pt and wife agreeable to plan.   SignedLavonia Drafts, PA-C 06/11/2017, 2:32 PM   Please refer to Dr. Kathlene Cote attestation of this note for management  and plan.

## 2017-08-28 IMAGING — CT CT GUIDANCE TISSUE ABLATION
1 of 7 series · 9 of 32 positions shown, 15 images · non-contrast
Comparison: none

CLINICAL DATA: Status post percutaneous cryoablation of a
biopsy-proven clear cell right renal carcinoma on 01/25/2016. There
has been development of an enhancing nodular recurrence along the
inferior and anterior margin of prior cryoablation measuring
approximately 1.6 cm in maximal diameter by prior CT. He now
presents for cryoablation of renal carcinoma recurrence.

[Series 2: i-spiral 5.0 b31f · axial · 0.89mm/px · z∈[+1196,+1361]mm · 9 of 59 slices shown, 15 images]
[im 6/59  soft-tissue]
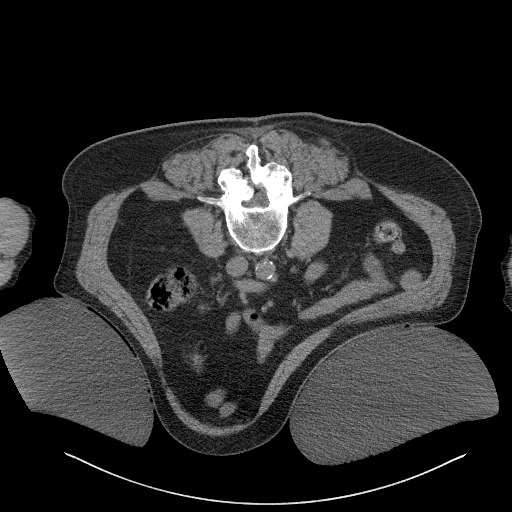
[im 6/59  bone]
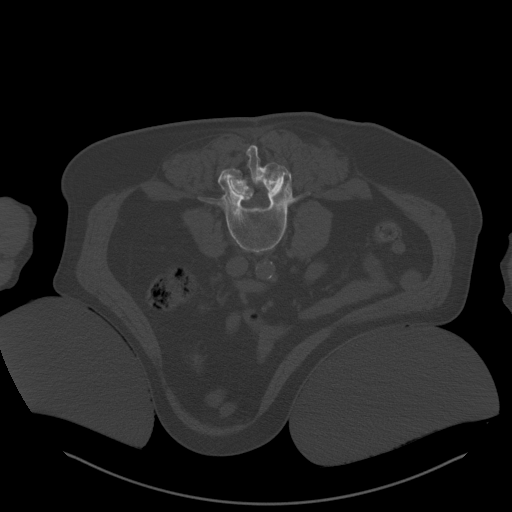
[im 12/59  soft-tissue]
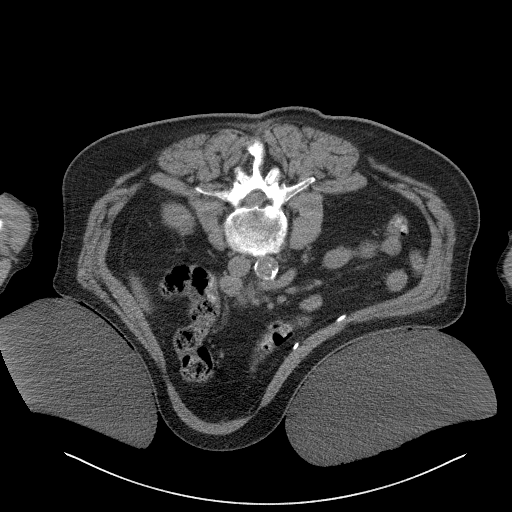
[im 18/59  soft-tissue]
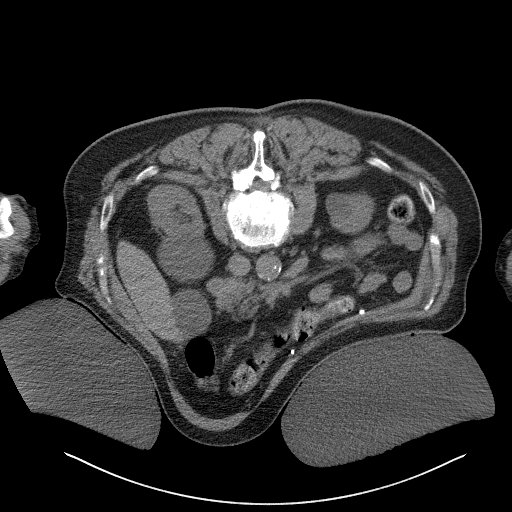
[im 24/59  soft-tissue]
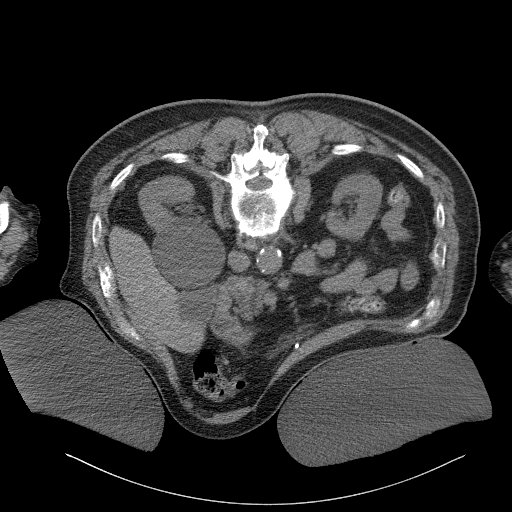
[im 30/59  soft-tissue]
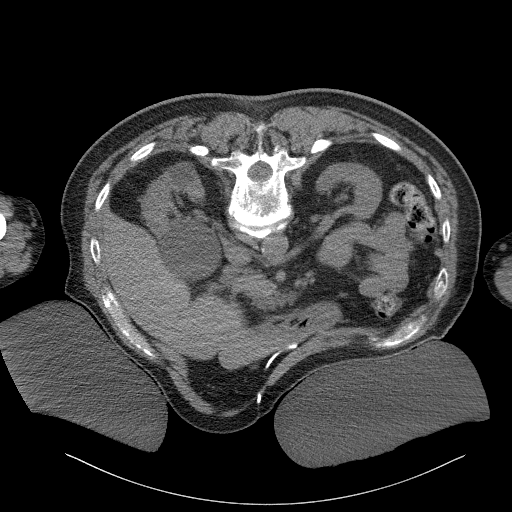
[im 35/59  soft-tissue]
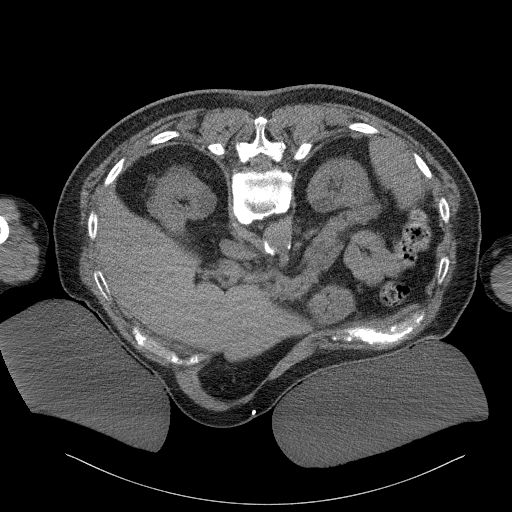
[im 35/59  lung]
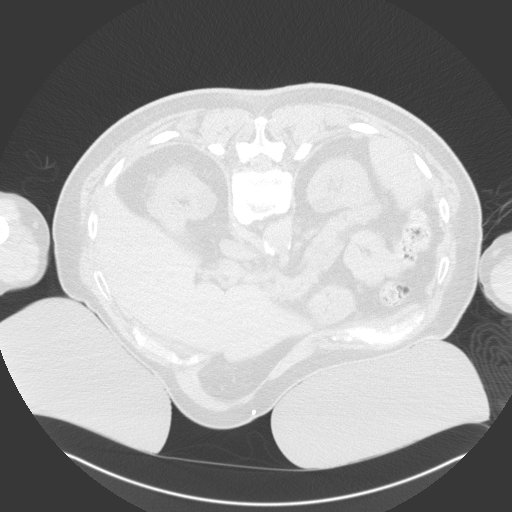
[im 41/59  soft-tissue]
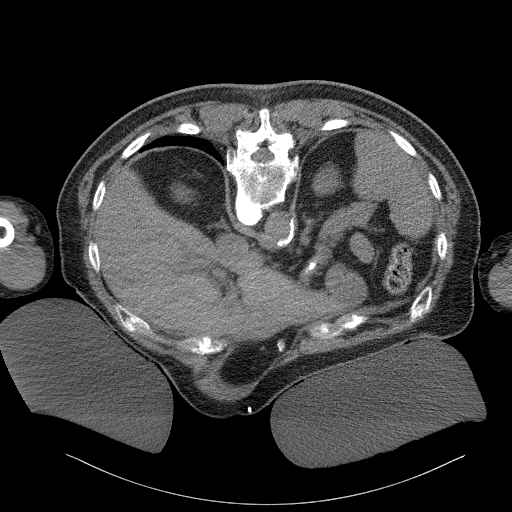
[im 41/59  lung]
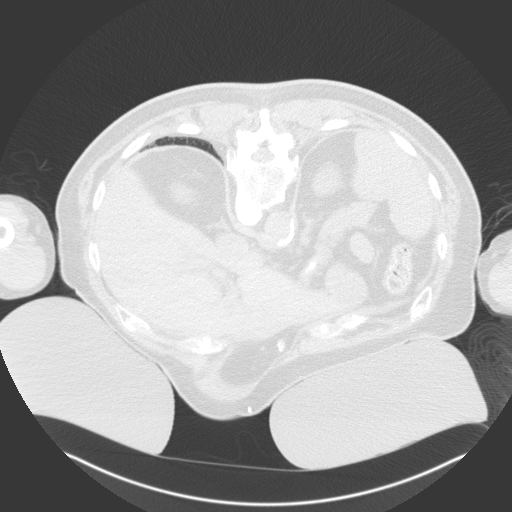
[im 47/59  soft-tissue]
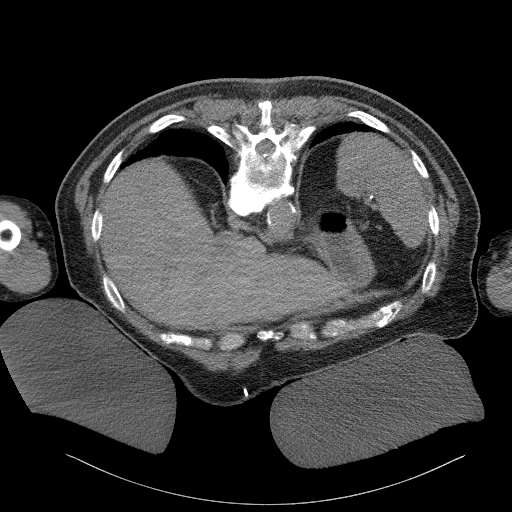
[im 47/59  lung]
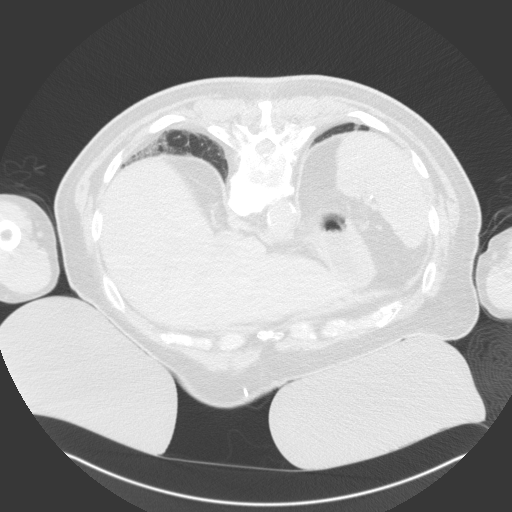
[im 53/59  soft-tissue]
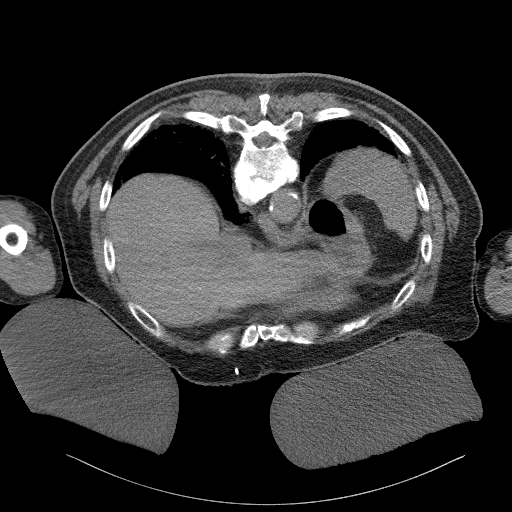
[im 53/59  lung]
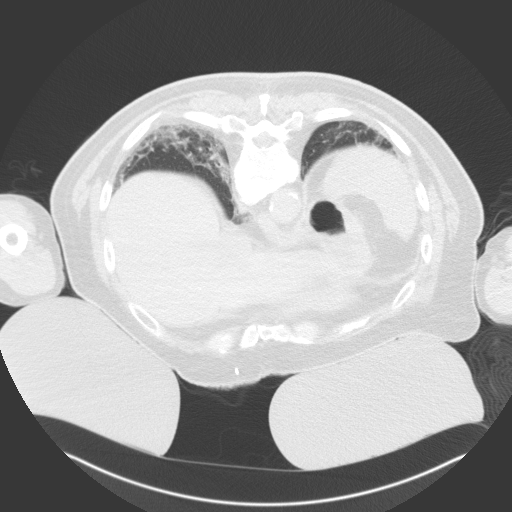
[im 53/59  bone]
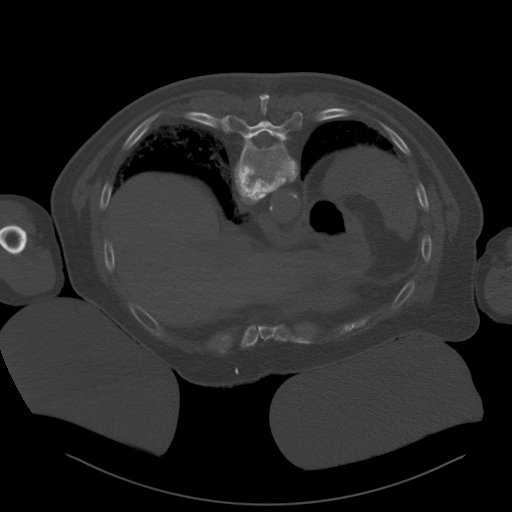

[9 of 32 positions shown; findings below may reference images not displayed]

EXAM:
CT-GUIDED PERCUTANEOUS CRYOABLATION OF RIGHT RENAL CARCINOMA

ANESTHESIA/SEDATION:
General

MEDICATIONS:
2 g IV Ancef. The antibiotic was administered in an appropriate time
interval prior to needle puncture of the skin.

CONTRAST:  50 mL Isovue 370 IV

PROCEDURE:
The procedure, risks, benefits, and alternatives were explained to
the patient. Questions regarding the procedure were encouraged and
answered. The patient understands and consents to the procedure.

The patient was placed under general anesthesia. Initial unenhanced
CT was performed in a prone position to localize the right kidney.

The right flank region was prepped with chlorhexidine in a sterile
fashion, and a sterile drape was applied covering the operative
field. A sterile gown and sterile gloves were used for the
procedure.

Contrast enhanced CT was performed of the kidneys after
administration of IV contrast.

Under CT guidance, a Galil Ice Alia Tiger percutaneous cryoablation
probe was advanced into the right renal tumor. Probe positioning was
confirmed by CT prior to cryoablation.

Cryoablation was performed through the cryoablation probe. Initial
10 minute cycle of cryoablation was performed. This was followed by
a 6 minute thaw cycle. A second 10 minute cycle of cryoablation was
then performed. During ablation, periodic CT imaging was performed
to monitor ice ball formation and morphology. After thaw, the
cryoablation probe was removed.

COMPLICATIONS:
None
FINDINGS: After contrast administration, there is again evidence of a nodular
focus of arterial phase contrast enhancement measuring approximately
1.7 cm in greatest diameter along the inferior, deep margin of prior
cryoablation. During cryoablation with a single probe, there is good
visualization of an ice ball that encompasses the region of nodular
enhancement.
IMPRESSION: CT guided percutaneous core cryoablation of right renal carcinoma
recurrence. The patient will be observed overnight. Initial
follow-up will be performed in approximately 4 weeks.

## 2017-11-18 ENCOUNTER — Other Ambulatory Visit (HOSPITAL_COMMUNITY): Payer: Self-pay | Admitting: Interventional Radiology

## 2017-11-18 DIAGNOSIS — C641 Malignant neoplasm of right kidney, except renal pelvis: Secondary | ICD-10-CM

## 2018-01-05 ENCOUNTER — Other Ambulatory Visit: Payer: Self-pay | Admitting: *Deleted

## 2018-01-05 ENCOUNTER — Ambulatory Visit
Admission: RE | Admit: 2018-01-05 | Discharge: 2018-01-05 | Disposition: A | Discharge status: Home / Self Care | Payer: Self-pay | Source: Ambulatory Visit | Attending: *Deleted | Admitting: *Deleted

## 2018-01-05 DIAGNOSIS — C641 Malignant neoplasm of right kidney, except renal pelvis: Secondary | ICD-10-CM

## 2018-01-21 HISTORY — PX: CHOLECYSTECTOMY: SHX55

## 2018-01-28 ENCOUNTER — Other Ambulatory Visit: Payer: Medicare Other

## 2018-03-04 ENCOUNTER — Ambulatory Visit
Admission: RE | Admit: 2018-03-04 | Discharge: 2018-03-04 | Disposition: A | Discharge status: Home / Self Care | Payer: Medicare Other | Source: Ambulatory Visit | Attending: Interventional Radiology | Admitting: Interventional Radiology

## 2018-03-04 DIAGNOSIS — C641 Malignant neoplasm of right kidney, except renal pelvis: Secondary | ICD-10-CM

## 2018-03-04 HISTORY — PX: IR RADIOLOGIST EVAL & MGMT: IMG5224

## 2018-03-24 NOTE — Progress Notes (Signed)
Chief Complaint: Follow-up status post prior cryoablation of right renal carcinoma.  History of Present Illness: Kenneth Todd is a 84 y.o. male here for follow-up and status post prior cryoablation on 01/25/2016 and 06/21/2016 to treat a clear-cell right renal carcinoma.  The second procedure was performed to treat a nodular area of recurrence.  Prior imaging on 06/11/2017 demonstrated a 9 to 10 mm focus of arterial hyperenhancement along the peri-or and anterior margin of prior ablation suspicious for recurrent enhancing nodular tumor.  Follow-up CT was recommended in 6 months and was performed at [facility] on 12/26/2017.  Since his prior office visit in February, Kenneth Todd has undergone cholecystectomy last October and is now being worked up for significant anemia and GI bleed.  Bleeding source has apparently not been identified.  He has undergone endoscopy and capsule studies.  He has been feeling very weak and fatigued related to anemia.  He denies any significant abdominal pain.  He has not had any urinary symptoms.  Past Medical History:  Diagnosis Date  . Complication of anesthesia    wife reports after sphincterotomy in 09/2015 with general anethesia pt had some problems catching breath on awakening. also mentions he "the may have had problems putting him to sleep, i dont remember"   . Coronary artery disease   . Dementia ([place])   . Depression   . GERD (gastroesophageal reflux disease)   . Heart disease   . Hyperlipidemia   . Kidney stone   . Pneumonia    aspiration   . Prostate cancer ([place])    prosate cancer  . Right renal mass   . Sleep apnea   . Stroke [facility])     Past Surgical History:  Procedure Laterality Date  . BACK SURGERY    . CARDIAC CATHETERIZATION  08/07/2015  . CERVICAL DISCECTOMY    . CHOLECYSTECTOMY  01/21/2018   emergent  . CORONARY ARTERY BYPASS GRAFT  2013  . Cryoablation of Right Renal Clear Cell Carcinoma Right 01/25/2016  . CSF SHUNT      . EYE SURGERY    . IR GENERIC HISTORICAL  02/27/2016   IR RADIOLOGIST EVAL & MGMT 02/27/2016 Aletta Edouard, MD [facility] INTERV RAD  . IR GENERIC HISTORICAL  12/07/2015   IR RADIOLOGIST EVAL & MGMT 12/07/2015 Aletta Edouard, MD [facility] INTERV RAD  . IR GENERIC HISTORICAL  05/08/2016   IR RADIOLOGIST EVAL & MGMT 05/08/2016 Aletta Edouard, MD [facility] INTERV RAD  . IR RADIOLOGIST EVAL & MGMT  07/17/2016  . IR RADIOLOGIST EVAL & MGMT  06/11/2017  . IR RADIOLOGIST EVAL & MGMT  03/04/2018  . LITHOTRIPSY    . LUMBAR LAMINECTOMY    . MITRAL VALVE REPAIR    . PARS PLANA VITRECTOMY W/ REPAIR OF MACULAR HOLE    . PROSTATE SURGERY    . RADIOFREQUENCY ABLATION Right 06/21/2016   Procedure: RIGHT RENAL CRYO ABLATION;  Surgeon: Aletta Edouard, MD;  Location: [facility];  Service: Anesthesiology;  Laterality: Right;  . RETINAL DETACHMENT REPAIR W/ SCLERAL BUCKLE LE    . SKIN GRAFT      Allergies: Cetirizine & related; Triamcinolone acetonide; Celecoxib; Ezetimibe; Hydrochlorothiazide; and Naproxen  Medications: Prior to Admission medications   Medication Sig Start Date End Date Taking? Authorizing Provider  acetaminophen (TYLENOL) 500 MG tablet Take 500-1,000 mg by mouth every 6 (six) hours as needed (for pain/headache.).    Yes [provider]  ALPRAZolam Duanne Moron) 0.5 MG tablet Take 0.25-0.5 mg by mouth  3 (three) times daily as needed for anxiety.    Yes [provider]  amoxicillin (AMOXIL) 500 MG capsule Take 2,000 mg by mouth See admin instructions. Take 4 capsules (2000 mg) by mouth prior to procedure. 03/20/16  Yes [provider]  aspirin 81 MG tablet Take 81 mg by mouth daily.   Yes [provider]  atorvastatin (LIPITOR) 40 MG tablet Take 40 mg by mouth every evening.    Yes [provider]  diphenhydrAMINE (BENADRYL) 25 mg capsule Take 25 mg by mouth every 6 (six) hours as needed (for allergic reactions/rash).    Yes [provider]  EPINEPHrine (EPIPEN  IJ) Inject as directed.   Yes [provider]  HYDROcodone-acetaminophen (NORCO) 7.5-325 MG tablet Take 1 tablet by mouth 2 (two) times daily as needed. For pain. 03/25/16  Yes [provider]  Memantine HCl ER (NAMENDA XR) 21 MG CP24 Take 21 mg by mouth daily.   Yes [provider]  metoprolol succinate (TOPROL-XL) 25 MG 24 hr tablet Take by mouth 2 (two) times daily.    Yes [provider]  nitroGLYCERIN (NITROSTAT) 0.4 MG SL tablet Place 0.4 mg under the tongue every 5 (five) minutes as needed for chest pain.   Yes [provider]  predniSONE (DELTASONE) 20 MG tablet Take 20 mg by mouth daily as needed.   Yes [provider]  rivastigmine (EXELON) 1.5 MG capsule Take 1.5 mg by mouth 2 (two) times daily.   Yes [provider]  sertraline (ZOLOFT) 100 MG tablet Take 50-100 mg by mouth 2 (two) times daily. Various based on patient need per patient spouse   Yes [provider]  traMADol (ULTRAM) 50 MG tablet Take 50 mg by mouth 4 (four) times daily as needed (for pain.).    Yes [provider]  valACYclovir (VALTREX) 500 MG tablet Take 500 mg by mouth 3 (three) times daily as needed (for eye virus (typically scheduled twice daily)). (Typically take twice daily)   Yes [provider]  cholecalciferol (VITAMIN D) 1000 units tablet Take 1,000 Units by mouth every evening.     [provider]  co-enzyme Q-10 30 MG capsule Take 30 mg by mouth every evening.     [provider]  Cyanocobalamin (VITAMIN B 12 PO) Take 1 tablet by mouth every evening.     [provider]  Multiple Vitamin (MULTIVITAMIN WITH MINERALS) TABS tablet Take 1 tablet by mouth daily.    [provider]  SIMPLY SALINE NA Place 1-2 sprays into the nose 2 (two) times daily as needed (for nasal congestion).    [provider]     No family history on file.  Social History   Socioeconomic History  .  Marital status: Married    Spouse name: Not on file  . Number of children: Not on file  . Years of education: Not on file  . Highest education level: Not on file  Occupational History  . Not on file  Social Needs  . Financial resource strain: Not on file  . Food insecurity:    Worry: Not on file    Inability: Not on file  . Transportation needs:    Medical: Not on file    Non-medical: Not on file  Tobacco Use  . Smoking status: Former Smoker    Packs/day: 1.00    Years: 26.00    Pack years: 26.00    Types: Cigarettes    Start date:  12/06/1948    Last attempt to quit: 12/07/1974    Years since quitting: 43.3  . Smokeless tobacco: Never Used  Substance and Sexual Activity  . Alcohol use: No    Comment: stopped about 7 yrs ago    . Drug use: Not on file  . Sexual activity: Not on file  Lifestyle  . Physical activity:    Days per week: Not on file    Minutes per session: Not on file  . Stress: Not on file  Relationships  . Social connections:    Talks on phone: Not on file    Gets together: Not on file    Attends religious service: Not on file    Active member of club or organization: Not on file    Attends meetings of clubs or organizations: Not on file    Relationship status: Not on file  Other Topics Concern  . Not on file  Social History Narrative  . Not on file    ECOG Status: 2 - Symptomatic, <50% confined to bed  Review of Systems: A 12 point ROS discussed and pertinent positives are indicated in the HPI above.  All other systems are negative.  Review of Systems  Constitutional: Positive for activity change and fatigue.  Respiratory: Negative.   Cardiovascular: Negative.   Gastrointestinal: Positive for blood in stool. Negative for abdominal distention, abdominal pain, nausea and vomiting.  Genitourinary: Negative.   Musculoskeletal: Negative.   Neurological: Negative.     Vital Signs: BP 117/63 (BP Location: Right Arm, Patient Position: Sitting, Cuff  Size: Normal)   Pulse 68   Temp (!) 97.2 F (36.2 C)   Resp 18   SpO2 97%   Physical Exam  Constitutional: He is oriented to person, place, and time. No distress.  Abdominal: Soft. He exhibits no distension. There is no tenderness. There is no rebound and no guarding.  Neurological: He is alert and oriented to person, place, and time.  Skin: He is not diaphoretic.  Vitals reviewed.   Imaging: Ir Radiologist Eval & Mgmt  Result Date: 03/04/2018 Please refer to notes tab for details about interventional procedure. (Op Note)   Labs:  CBC: No results for input(s): WBC, HGB, HCT, PLT in the last 8760 hours.  COAGS: No results for input(s): INR, APTT in the last 8760 hours.  BMP: Recent Labs    06/11/17 1157  CREATININE 1.10     Assessment and Plan:  I reviewed the outside CT from [place] dated 12/26/2017 with Kenneth Todd and his wife.  This is compared to the study on 06/11/2017.  By my measurements, the area of arterial hyperenhancement along the margin of prior ablation has increased minimally and measures approximately 11 mm compared to 9-10 mm in February.  Otherwise stable appearance of the ablation defect in the right kidney.  No new enhancing renal lesions identified.    Kenneth Todd performance status has somewhat declined due to the recent anemia and GI blood loss.  Due to active work-up of this problem, I did not recommend any immediate action or treatment regarding the small area of enhancement at the site of prior cryoablation given its relative stability.  I recommended we obtain follow-up CT imaging in 1 year and determine if there is further growth of the nodular area.  Depending on his overall condition and performance status at that time, we could discuss potential microwave thermal ablation.  Electronically SignedAletta Edouard T 03/24/2018, 10:22 AM     I spent  a total of 15 Minutes in face to face in clinical consultation, greater than 50% of which  was counseling/coordinating care post ablation of a right renal carcinoma.

## 2019-04-28 ENCOUNTER — Other Ambulatory Visit: Payer: Self-pay | Admitting: Interventional Radiology

## 2019-04-28 ENCOUNTER — Other Ambulatory Visit: Payer: Self-pay

## 2019-04-28 DIAGNOSIS — N2889 Other specified disorders of kidney and ureter: Secondary | ICD-10-CM

## 2019-06-23 ENCOUNTER — Telehealth: Payer: Medicare Other

## 2019-06-29 ENCOUNTER — Ambulatory Visit
Admission: RE | Admit: 2019-06-29 | Discharge: 2019-06-29 | Disposition: A | Discharge status: Home / Self Care | Payer: Medicare Other | Source: Ambulatory Visit | Attending: Interventional Radiology | Admitting: Interventional Radiology

## 2019-06-29 ENCOUNTER — Other Ambulatory Visit: Payer: Self-pay | Admitting: Interventional Radiology

## 2019-06-29 DIAGNOSIS — N2889 Other specified disorders of kidney and ureter: Secondary | ICD-10-CM

## 2019-07-07 ENCOUNTER — Ambulatory Visit
Admission: RE | Admit: 2019-07-07 | Discharge: 2019-07-07 | Disposition: A | Discharge status: Home / Self Care | Payer: Medicare Other | Source: Ambulatory Visit | Attending: Interventional Radiology | Admitting: Interventional Radiology

## 2019-07-07 ENCOUNTER — Encounter: Payer: Self-pay | Admitting: *Deleted

## 2019-07-07 ENCOUNTER — Other Ambulatory Visit: Payer: Self-pay

## 2019-07-07 DIAGNOSIS — N2889 Other specified disorders of kidney and ureter: Secondary | ICD-10-CM

## 2019-07-07 HISTORY — PX: IR RADIOLOGIST EVAL & MGMT: IMG5224

## 2019-07-07 NOTE — Progress Notes (Addendum)
Chief Complaint:   History of Present Illness: Kenneth Todd is a 86 y.o. 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 Follow-up imaging demonstrated hypervascular nodular enhancement along the inferior and anterior border of prior ablation. This nodular area of recurrence was treated with cryoablation on 06/21/2016.    Follow up imaging on 06/11/2017 demonstrated a 9 to 10 mm focus of arterial hyperenhancement along the superior and anterior margin of prior ablation suspicious for recurrent enhancing nodular tumor.  This area of nodular enhancement increased to approximately 11 mm on a study dated 12/26/2017.  It was elected at that time to wait to perform repeat thermal ablation due to decline in performance status related to anemia and GI bleed.  Recent follow-up CT was performed on 06/17/2019.  There was a longer than planned interval between scans due to relatively poor health over the last year and a half with multiple strokes.  He sustained a stroke in December 2019 and recently in October 2020 suffered an intracranial hemorrhage with a left cerebellar hemorrhage and intraventricular rupture.  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  He has been slow to recover and is very unsteady on his feet requiring a walker to walk short distances.  He has completed some initial physical therapy, Occupational Therapy and speech therapy but requires some additional outpatient therapy.  He has had a decline in memory.  He is able to eat normally but has lost some weight.  Past Medical History:  Diagnosis Date  . Complication of anesthesia    wife reports after sphincterotomy in 09/2015 with general anethesia pt had some problems catching breath on awakening. also mentions he "the may have had problems putting him to sleep, i dont remember"   . Coronary artery disease   . Dementia ([place])   .  Depression   . GERD (gastroesophageal reflux disease)   . Heart disease   . Hyperlipidemia   . Kidney stone   . Pneumonia    aspiration   . Prostate cancer ([place])    prosate cancer  . Right renal mass   . Sleep apnea   . Stroke [facility])     Past Surgical History:  Procedure Laterality Date  . BACK SURGERY    . CARDIAC CATHETERIZATION  08/07/2015  . CERVICAL DISCECTOMY    . CHOLECYSTECTOMY  01/21/2018   emergent  . CORONARY ARTERY BYPASS GRAFT  2013  . Cryoablation of Right Renal Clear Cell Carcinoma Right 01/25/2016  . CSF SHUNT    . EYE SURGERY    . IR GENERIC HISTORICAL  02/27/2016   IR RADIOLOGIST EVAL & MGMT 02/27/2016 Aletta Edouard, MD [facility] INTERV RAD  . IR GENERIC HISTORICAL  12/07/2015   IR RADIOLOGIST EVAL & MGMT 12/07/2015 Aletta Edouard, MD [facility] INTERV RAD  . IR GENERIC HISTORICAL  05/08/2016   IR RADIOLOGIST EVAL & MGMT 05/08/2016 Aletta Edouard, MD [facility] INTERV RAD  . IR RADIOLOGIST EVAL & MGMT  07/17/2016  . IR RADIOLOGIST EVAL & MGMT  06/11/2017  . IR RADIOLOGIST EVAL & MGMT  03/04/2018  . LITHOTRIPSY    . LUMBAR LAMINECTOMY    . MITRAL VALVE REPAIR    . PARS PLANA VITRECTOMY W/ REPAIR OF MACULAR HOLE    . PROSTATE SURGERY    . RADIOFREQUENCY ABLATION Right 06/21/2016   Procedure: RIGHT RENAL CRYO ABLATION;  Surgeon: Aletta Edouard, MD;  Location:  [facility];  Service: Anesthesiology;  Laterality: Right;  . RETINAL DETACHMENT REPAIR W/ SCLERAL BUCKLE LE    . SKIN GRAFT      Allergies: Cetirizine & related, Triamcinolone acetonide, Celecoxib, Ezetimibe, Hydrochlorothiazide, and Naproxen  Medications: Prior to Admission medications   Medication Sig Start Date End Date Taking? Authorizing Provider  acetaminophen (TYLENOL) 500 MG tablet Take 500-1,000 mg by mouth every 6 (six) hours as needed (for pain/headache.).     [provider]  ALPRAZolam Duanne Moron) 0.5 MG tablet Take 0.25-0.5 mg by mouth 3 (three) times daily as needed for anxiety.     [provider]  amoxicillin (AMOXIL) 500 MG capsule Take 2,000 mg by mouth See admin instructions. Take 4 capsules (2000 mg) by mouth prior to procedure. 03/20/16   [provider]  aspirin 81 MG tablet Take 81 mg by mouth daily.    [provider]  atorvastatin (LIPITOR) 40 MG tablet Take 40 mg by mouth every evening.     [provider]  cholecalciferol (VITAMIN D) 1000 units tablet Take 1,000 Units by mouth every evening.     [provider]  co-enzyme Q-10 30 MG capsule Take 30 mg by mouth every evening.     [provider]  Cyanocobalamin (VITAMIN B 12 PO) Take 1 tablet by mouth every evening.     [provider]  diphenhydrAMINE (BENADRYL) 25 mg capsule Take 25 mg by mouth every 6 (six) hours as needed (for allergic reactions/rash).     [provider]  EPINEPHrine (EPIPEN IJ) Inject as directed.    [provider]  HYDROcodone-acetaminophen (NORCO) 7.5-325 MG tablet Take 1 tablet by mouth 2 (two) times daily as needed. For pain. 03/25/16   [provider]  Memantine HCl ER (NAMENDA XR) 21 MG CP24 Take 21 mg by mouth daily.    [provider]  metoprolol succinate (TOPROL-XL) 25 MG 24 hr tablet Take by mouth 2 (two) times daily.     [provider]  Multiple Vitamin (MULTIVITAMIN WITH MINERALS) TABS tablet Take 1 tablet by mouth daily.    [provider]  nitroGLYCERIN (NITROSTAT) 0.4 MG SL tablet Place 0.4 mg under the tongue every 5 (five) minutes as needed for chest pain.    [provider]  predniSONE (DELTASONE) 20 MG tablet Take 20 mg by mouth daily as needed.    [provider]  rivastigmine (EXELON) 1.5 MG capsule Take 1.5 mg by mouth 2 (two) times daily.    [provider]  sertraline (ZOLOFT) 100 MG tablet Take 50-100 mg by mouth 2 (two) times daily. Various based on patient need per patient spouse    [provider]  SIMPLY SALINE NA Place  1-2 sprays into the nose 2 (two) times daily as needed (for nasal congestion).    [provider]  traMADol (ULTRAM) 50 MG tablet Take 50 mg by mouth 4 (four) times daily as needed (for pain.).     [provider]  valACYclovir (VALTREX) 500 MG tablet Take 500 mg by mouth 3 (three) times daily as needed (for eye virus (typically scheduled twice daily)). (Typically take twice daily)    [provider]     No family history on file.  Social History   Socioeconomic History  . Marital status: Married    Spouse name: Not on file  . Number of children: Not on file  . Years of education: Not on file  . Highest education level:  Not on file  Occupational History  . Not on file  Tobacco Use  . Smoking status: Former Smoker    Packs/day: 1.00    Years: 26.00    Pack years: 26.00    Types: Cigarettes    Start date: 12/06/1948    Quit date: 12/07/1974    Years since quitting: 44.6  . Smokeless tobacco: Never Used  Substance and Sexual Activity  . Alcohol use: No    Comment: stopped about 7 yrs ago    . Drug use: Not on file  . Sexual activity: Not on file  Other Topics Concern  . Not on file  Social History Narrative  . Not on file   Social Determinants of Health   Financial Resource Strain:   . Difficulty of Paying Living Expenses:   Food Insecurity:   . Worried About Charity fundraiser in the Last Year:   . Arboriculturist in the Last Year:   Transportation Needs:   . Film/video editor (Medical):   Marland Kitchen Lack of Transportation (Non-Medical):   Physical Activity:   . Days of Exercise per Week:   . Minutes of Exercise per Session:   Stress:   . Feeling of Stress :   Social Connections:   . Frequency of Communication with Friends and Family:   . Frequency of Social Gatherings with Friends and Family:   . Attends Religious Services:   . Active Member of Clubs or Organizations:   . Attends Archivist Meetings:   Marland Kitchen Marital Status:      ECOG Status: 3 - Symptomatic, >50% confined to bed  Review of Systems  Constitutional: Positive for activity change.  Respiratory: Negative.   Cardiovascular: Negative.   Gastrointestinal: Negative.   Genitourinary: Negative.   Musculoskeletal: Positive for gait problem.  Neurological: Positive for weakness.    Review of Systems: A 12 point ROS discussed and pertinent positives are indicated in the HPI above.  All other systems are negative.  Physical Exam No direct physical exam was performed (except for noted visual exam findings with Video Visits).   Vital Signs: There were no vitals taken for this visit.  Imaging: CT OUTSIDE FILMS BODY/ABD/PELVIS  Result Date: 06/29/2019 This examination belongs to an outside facility and is stored here for comparison purposes only.  Contact the originating outside institution for any associated report or interpretation.   Labs:  CBC: No results for input(s): WBC, HGB, HCT, PLT in the last 8760 hours.  COAGS: No results for input(s): INR, APTT in the last 8760 hours.  BMP: No results for input(s): NA, K, CL, CO2, GLUCOSE, BUN, CALCIUM, CREATININE, GFRNONAA, GFRAA in the last 8760 hours.  Invalid input(s): CMP  LIVER FUNCTION TESTS: No results for input(s): BILITOT, AST, ALT, ALKPHOS, PROT, ALBUMIN in the last 8760 hours.  TUMOR MARKERS: No results for input(s): AFPTM, CEA, CA199, CHROMGRNA in the last 8760 hours.  Assessment and Plan:  I spoke by phone today with Mrs. Dunnigan.  Her husband was unable to come to the phone.  I reviewed imaging findings with her from the most recent CT dated 06/17/2019 at [facility]  This demonstrates enlargement of the nodular area of increased arterial phase enhancement in the interpolar right kidney along the superior aspect of prior ablation.  This now measures approximately 2.2 cm in greatest diameter by my measurements compared to 1.1 cm in 2019.  Although clearly an area of  recurrence of renal carcinoma and technically treatable  by percutaneous thermal ablation given resistance to 2 prior cryoablation procedures, I told Mrs. Ogarro that I am concerned that her husband would not tolerate a procedure currently given his further decline after recent strokes including a significant hemorrhagic stroke 5 months ago.  He would be at clear risk of sustaining another stroke if placed under general anesthesia at this time and I am not sure that at his age that he would be able to tolerate general anesthesia in the future given his decline and at least 2 documented strokes since December 2019.  His performance status currently is fairly poor.  I did offer to continue to follow the area of renal carcinoma recurrence by imaging in the event that Mr. Machnik should make a more significant recovery that might allow him to tolerate an ablation procedure under general anesthesia in the future.  Mrs. Casebolt would like the area to continue to be followed.  I have recommended a follow-up CT in roughly 1 year.  Electronically Signed: Azzie Roup 07/07/2019, 2:14 PM     I spent a total of 15 Minutes in remote  clinical consultation, greater than 50% of which was counseling/coordinating care for a right renal carcinoma.    Visit type: Audio only (telephone). Audio (no video) only due to patient's lack of internet/smartphone capability. Alternative for in-person consultation at [facility], [address] [place], [place], [place]. This visit type was conducted due to national recommendations for restrictions regarding the COVID-19 Pandemic (e.g. social distancing).  This format is felt to be most appropriate for this patient at this time.  All issues noted in this document were discussed and addressed.

## 2020-06-29 ENCOUNTER — Other Ambulatory Visit: Payer: Self-pay | Admitting: Interventional Radiology

## 2020-06-29 ENCOUNTER — Other Ambulatory Visit: Payer: Self-pay

## 2020-06-29 DIAGNOSIS — N2889 Other specified disorders of kidney and ureter: Secondary | ICD-10-CM

## 2020-07-18 ENCOUNTER — Other Ambulatory Visit: Payer: Self-pay | Admitting: Interventional Radiology

## 2020-07-18 ENCOUNTER — Ambulatory Visit
Admission: RE | Admit: 2020-07-18 | Discharge: 2020-07-18 | Disposition: A | Discharge status: Home / Self Care | Payer: Self-pay | Source: Ambulatory Visit | Attending: Interventional Radiology | Admitting: Interventional Radiology

## 2020-07-18 DIAGNOSIS — N2889 Other specified disorders of kidney and ureter: Secondary | ICD-10-CM

## 2020-08-03 ENCOUNTER — Ambulatory Visit
Admission: RE | Admit: 2020-08-03 | Discharge: 2020-08-03 | Disposition: A | Discharge status: Home / Self Care | Payer: Medicare Other | Source: Ambulatory Visit | Attending: Interventional Radiology | Admitting: Interventional Radiology

## 2020-08-03 ENCOUNTER — Other Ambulatory Visit: Payer: Medicare Other

## 2020-08-03 ENCOUNTER — Other Ambulatory Visit: Payer: Self-pay

## 2020-08-03 DIAGNOSIS — N2889 Other specified disorders of kidney and ureter: Secondary | ICD-10-CM

## 2020-08-03 HISTORY — PX: IR RADIOLOGIST EVAL & MGMT: IMG5224

## 2020-08-03 NOTE — Progress Notes (Signed)
Chief Complaint: Patient was consulted remotely today (TeleHealth) for follow-up after prior ablation procedures of a right clear-cell renal carcinoma.  History of Present Illness: Kenneth Todd is a 87 y.o. 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 Follow-up imaging demonstrated hypervascular nodular enhancement along the inferior and anterior border of prior ablation. This nodular area of recurrence was treated with cryoablation on 06/21/2016.    Follow up imaging on 06/11/2017 demonstrated a 9 to 10 mm focus of arterial hyperenhancement along the superior and anterior margin of prior ablation suspicious for recurrent enhancing nodular tumor.  This area of nodular enhancement increased to approximately 11 mm on a study dated 12/26/2017. A follow-up CT on 06/17/2019 demonstrated enlarged nodular area of enhancement in the interpolar right kidney measuring approximately 2.2 cm in greatest diameter.  It was decided to not treat this recurrence with additional ablation due to declining health after strokes including a significant hemorrhagic stroke.  He recently underwent some additional physical therapy and Occupational Therapy.  Performance status remains marginal with inability to walk without assistance and occasional falls including a fall that resulted in a humeral fracture last year.  He has not had any urinary symptoms or abdominal/flank pain.   Past Medical History:  Diagnosis Date  . Complication of anesthesia    wife reports after sphincterotomy in 09/2015 with general anethesia pt had some problems catching breath on awakening. also mentions he "the may have had problems putting him to sleep, i dont remember"   . Coronary artery disease   . Dementia ([place])   . Depression   . GERD (gastroesophageal reflux disease)   . Heart disease   . Hyperlipidemia   . Kidney stone   . Pneumonia    aspiration   .  Prostate cancer ([place])    prosate cancer  . Right renal mass   . Sleep apnea   . Stroke [facility])     Past Surgical History:  Procedure Laterality Date  . BACK SURGERY    . CARDIAC CATHETERIZATION  08/07/2015  . CERVICAL DISCECTOMY    . CHOLECYSTECTOMY  01/21/2018   emergent  . CORONARY ARTERY BYPASS GRAFT  2013  . Cryoablation of Right Renal Clear Cell Carcinoma Right 01/25/2016  . CSF SHUNT    . EYE SURGERY    . IR GENERIC HISTORICAL  02/27/2016   IR RADIOLOGIST EVAL & MGMT 02/27/2016 Aletta Edouard, MD [facility] INTERV RAD  . IR GENERIC HISTORICAL  12/07/2015   IR RADIOLOGIST EVAL & MGMT 12/07/2015 Aletta Edouard, MD [facility] INTERV RAD  . IR GENERIC HISTORICAL  05/08/2016   IR RADIOLOGIST EVAL & MGMT 05/08/2016 Aletta Edouard, MD [facility] INTERV RAD  . IR RADIOLOGIST EVAL & MGMT  07/17/2016  . IR RADIOLOGIST EVAL & MGMT  06/11/2017  . IR RADIOLOGIST EVAL & MGMT  03/04/2018  . IR RADIOLOGIST EVAL & MGMT  07/07/2019  . LITHOTRIPSY    . LUMBAR LAMINECTOMY    . MITRAL VALVE REPAIR    . PARS PLANA VITRECTOMY W/ REPAIR OF MACULAR HOLE    . PROSTATE SURGERY    . RADIOFREQUENCY ABLATION Right 06/21/2016   Procedure: RIGHT RENAL CRYO ABLATION;  Surgeon: Aletta Edouard, MD;  Location: [facility];  Service: Anesthesiology;  Laterality: Right;  . RETINAL DETACHMENT REPAIR W/ SCLERAL BUCKLE LE    . SKIN GRAFT      Allergies: Cetirizine & related, Triamcinolone acetonide, Celecoxib, Ezetimibe, Hydrochlorothiazide, and Naproxen  Medications: Prior to Admission medications   Medication Sig Start Date End Date Taking? Authorizing Provider  acetaminophen (TYLENOL) 500 MG tablet Take 500-1,000 mg by mouth every 6 (six) hours as needed (for pain/headache.).     [provider]  ALPRAZolam Duanne Moron) 0.5 MG tablet Take 0.25-0.5 mg by mouth 3 (three) times daily as needed for anxiety.     [provider]  amoxicillin (AMOXIL) 500 MG capsule Take 2,000 mg by mouth See admin instructions. Take 4  capsules (2000 mg) by mouth prior to procedure. 03/20/16   [provider]  aspirin 81 MG tablet Take 81 mg by mouth daily.    [provider]  atorvastatin (LIPITOR) 40 MG tablet Take 40 mg by mouth every evening.     [provider]  cholecalciferol (VITAMIN D) 1000 units tablet Take 1,000 Units by mouth every evening.     [provider]  co-enzyme Q-10 30 MG capsule Take 30 mg by mouth every evening.     [provider]  Cyanocobalamin (VITAMIN B 12 PO) Take 1 tablet by mouth every evening.     [provider]  diphenhydrAMINE (BENADRYL) 25 mg capsule Take 25 mg by mouth every 6 (six) hours as needed (for allergic reactions/rash).     [provider]  EPINEPHrine (EPIPEN IJ) Inject as directed.    [provider]  HYDROcodone-acetaminophen (NORCO) 7.5-325 MG tablet Take 1 tablet by mouth 2 (two) times daily as needed. For pain. 03/25/16   [provider]  Memantine HCl ER (NAMENDA XR) 21 MG CP24 Take 21 mg by mouth daily.    [provider]  metoprolol succinate (TOPROL-XL) 25 MG 24 hr tablet Take by mouth 2 (two) times daily.     [provider]  Multiple Vitamin (MULTIVITAMIN WITH MINERALS) TABS tablet Take 1 tablet by mouth daily.    [provider]  nitroGLYCERIN (NITROSTAT) 0.4 MG SL tablet Place 0.4 mg under the tongue every 5 (five) minutes as needed for chest pain.    [provider]  predniSONE (DELTASONE) 20 MG tablet Take 20 mg by mouth daily as needed.    [provider]  rivastigmine (EXELON) 1.5 MG capsule Take 1.5 mg by mouth 2 (two) times daily.    [provider]  sertraline (ZOLOFT) 100 MG tablet Take 50-100 mg by mouth 2 (two) times daily. Various based on patient need per patient spouse    [provider]  SIMPLY SALINE NA Place 1-2 sprays into the nose 2 (two) times daily as needed (for nasal congestion).    [provider]   traMADol (ULTRAM) 50 MG tablet Take 50 mg by mouth 4 (four) times daily as needed (for pain.).     [provider]  valACYclovir (VALTREX) 500 MG tablet Take 500 mg by mouth 3 (three) times daily as needed (for eye virus (typically scheduled twice daily)). (Typically take twice daily)    [provider]     No family history on file.  Social History   Socioeconomic History  . Marital status: Married    Spouse name: Not on file  . Number of children: Not on file  . Years of education: Not on file  . Highest education level: Not on file  Occupational History  . Not on file  Tobacco Use  . Smoking status: Former Smoker    Packs/day: 1.00    Years: 26.00    Pack years: 26.00    Types:  Cigarettes    Start date: 12/06/1948    Quit date: 12/07/1974    Years since quitting: 45.6  . Smokeless tobacco: Never Used  Substance and Sexual Activity  . Alcohol use: No    Comment: stopped about 7 yrs ago    . Drug use: Not on file  . Sexual activity: Not on file  Other Topics Concern  . Not on file  Social History Narrative  . Not on file   Social Determinants of Health   Financial Resource Strain: Not on file  Food Insecurity: Not on file  Transportation Needs: Not on file  Physical Activity: Not on file  Stress: Not on file  Social Connections: Not on file    ECOG Status: 3 - Symptomatic, >50% confined to bed  Review of Systems  Constitutional: Positive for activity change.  Respiratory: Negative.   Cardiovascular: Negative.   Gastrointestinal: Negative.   Genitourinary: Negative.   Musculoskeletal: Positive for gait problem.  Neurological: Positive for speech difficulty, weakness and numbness.    Review of Systems: A 12 point ROS discussed and pertinent positives are indicated in the HPI above.  All other systems are negative.  Physical Exam No direct physical exam was performed (except for noted visual exam findings with Video Visits).   Vital  Signs: There were no vitals taken for this visit.  Imaging: No results found.  Labs:  CBC: No results for input(s): WBC, HGB, HCT, PLT in the last 8760 hours.  COAGS: No results for input(s): INR, APTT in the last 8760 hours.  BMP: No results for input(s): NA, K, CL, CO2, GLUCOSE, BUN, CALCIUM, CREATININE, GFRNONAA, GFRAA in the last 8760 hours.  Invalid input(s): CMP  LIVER FUNCTION TESTS: No results for input(s): BILITOT, AST, ALT, ALKPHOS, PROT, ALBUMIN in the last 8760 hours.   Assessment and Plan:  I spoke with Kenneth Todd and his wife over the phone.  A follow-up CT was performed at [facility] on 06/17/2020. This demonstrates a 2.5 cm area of enhancement in the interpolar right kidney consistent with recurrence which appears similar to the scan 1 year ago with probable very slight growth.  Note was made of a small potential 10 mm area of enhancement along the left posterior bladder wall.  Growth rate of the probable area of recurrent enhancing tumor at the level of previously ablated clear so carcinoma of the right kidney peers to have slowed since prior imaging in 2019.  There is just slight suggestion of 2 to 3 mm of enlargement over the past year.  Given his poor health and performance status, I again did not recommend putting Kenneth Todd through a third ablation procedure under general anesthesia.  At the size of the current area of enhancement and slow growth rate, I told Mr. and Kenneth Todd that this would be unlikely to be problematic or symptomatic in the next several years.  I did recommend he consult a Urologist regarding the potential small new bladder tumor by CT.  I offered to continue surveillance of the area of right renal enhancement or stop imaging altogether unless he were to develop significant symptoms.  Kenneth Todd would like to continue surveillance if he is doing reasonably well around this time next year.  We will contact them around March of next  year to determine whether he may be able to tolerate a follow-up CT study.   Electronically Signed: Azzie Roup 08/03/2020, 2:44 PM     I spent a total of  15 Minutes in remote  clinical consultation, greater than 50% of which was counseling/coordinating care post ablation of a right renal carcinoma.    Visit type: Audio only (telephone). Audio (no video) only due to patient's lack of internet/smartphone capability. Alternative for in-person consultation at [facility], [address] [place], [place], [place]. This visit type was conducted due to national recommendations for restrictions regarding the COVID-19 Pandemic (e.g. social distancing).  This format is felt to be most appropriate for this patient at this time.  All issues noted in this document were discussed and addressed.

## 2021-02-06 ENCOUNTER — Institutional Professional Consult (permissible substitution)
Admission: RE | Admit: 2021-02-06 | Discharge: 2021-02-08 | Payer: Medicare Other | Source: Other Acute Inpatient Hospital | Attending: Internal Medicine | Admitting: Internal Medicine

## 2021-02-06 ENCOUNTER — Other Ambulatory Visit (HOSPITAL_COMMUNITY): Payer: Medicare Other

## 2021-02-06 DIAGNOSIS — J9621 Acute and chronic respiratory failure with hypoxia: Secondary | ICD-10-CM

## 2021-02-06 DIAGNOSIS — F0390 Unspecified dementia without behavioral disturbance: Secondary | ICD-10-CM

## 2021-02-06 DIAGNOSIS — Z931 Gastrostomy status: Secondary | ICD-10-CM

## 2021-02-06 DIAGNOSIS — I5022 Chronic systolic (congestive) heart failure: Secondary | ICD-10-CM

## 2021-02-06 DIAGNOSIS — J189 Pneumonia, unspecified organism: Secondary | ICD-10-CM

## 2021-02-06 DIAGNOSIS — Z930 Tracheostomy status: Secondary | ICD-10-CM

## 2021-02-06 DIAGNOSIS — I480 Paroxysmal atrial fibrillation: Secondary | ICD-10-CM

## 2021-02-06 DIAGNOSIS — S06309S Unspecified focal traumatic brain injury with loss of consciousness of unspecified duration, sequela: Secondary | ICD-10-CM

## 2021-02-06 LAB — BLOOD GAS, ARTERIAL
Acid-Base Excess: 3.8 mmol/L — ABNORMAL HIGH (ref 0.0–2.0)
Bicarbonate: 27.3 mmol/L (ref 20.0–28.0)
FIO2: 28
O2 Saturation: 99.2 %
Patient temperature: 37
pCO2 arterial: 37.6 mmHg (ref 32.0–48.0)
pH, Arterial: 7.475 — ABNORMAL HIGH (ref 7.350–7.450)
pO2, Arterial: 118 mmHg — ABNORMAL HIGH (ref 83.0–108.0)

## 2021-02-06 MED ORDER — DIATRIZOATE MEGLUMINE & SODIUM 66-10 % PO SOLN
ORAL | Status: AC
  Administered 2021-02-06: 30 mL
  Filled 2021-02-06: qty 30

## 2021-02-07 ENCOUNTER — Other Ambulatory Visit (HOSPITAL_COMMUNITY): Payer: Medicare Other

## 2021-02-07 DIAGNOSIS — F0390 Unspecified dementia without behavioral disturbance: Secondary | ICD-10-CM

## 2021-02-07 DIAGNOSIS — I480 Paroxysmal atrial fibrillation: Secondary | ICD-10-CM | POA: Diagnosis not present

## 2021-02-07 DIAGNOSIS — J9621 Acute and chronic respiratory failure with hypoxia: Secondary | ICD-10-CM

## 2021-02-07 DIAGNOSIS — I5022 Chronic systolic (congestive) heart failure: Secondary | ICD-10-CM

## 2021-02-07 DIAGNOSIS — S06309S Unspecified focal traumatic brain injury with loss of consciousness of unspecified duration, sequela: Secondary | ICD-10-CM

## 2021-02-07 LAB — CBC
HCT: 32.2 % — ABNORMAL LOW (ref 39.0–52.0)
Hemoglobin: 9.9 g/dL — ABNORMAL LOW (ref 13.0–17.0)
MCH: 27.3 pg (ref 26.0–34.0)
MCHC: 30.7 g/dL (ref 30.0–36.0)
MCV: 89 fL (ref 80.0–100.0)
Platelets: 305 10*3/uL (ref 150–400)
RBC: 3.62 MIL/uL — ABNORMAL LOW (ref 4.22–5.81)
RDW: 15.2 % (ref 11.5–15.5)
WBC: 7 10*3/uL (ref 4.0–10.5)
nRBC: 0 % (ref 0.0–0.2)

## 2021-02-07 LAB — BASIC METABOLIC PANEL
Anion gap: 6 (ref 5–15)
BUN: 17 mg/dL (ref 8–23)
CO2: 28 mmol/L (ref 22–32)
Calcium: 8.6 mg/dL — ABNORMAL LOW (ref 8.9–10.3)
Chloride: 99 mmol/L (ref 98–111)
Creatinine, Ser: 0.61 mg/dL (ref 0.61–1.24)
GFR, Estimated: >60 mL/min (ref >60)
Glucose, Bld: 112 mg/dL — ABNORMAL HIGH (ref 70–99)
Potassium: 4.2 mmol/L (ref 3.5–5.1)
Sodium: 133 mmol/L — ABNORMAL LOW (ref 135–145)

## 2021-02-07 NOTE — Consult Note (Signed)
[facility] [place]  PULMONARY SERVICE  Date of Service: 02/07/2021  PULMONARY CRITICAL CARE CONSULT   Kenneth Todd  Kenneth Todd  DOB: February 01, 1931   DOA: 02/06/2021  Referring Physician: Satira Sark, MD  HPI: Kenneth Todd is a 87 y.o. male seen for follow up of Acute on Chronic Respiratory Failure.  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  The patient had been found to have a subarachnoid hemorrhage with intraventricular hemorrhage after falling and hitting the wheelchair.  Patient's mental status had worsened.  Came into the hospital evaluated was started on amantadine as well as Provigil for neuro stimulation at the other facility.  The patient was not able to come off of the ventilator and eventually had to have a tracheostomy done.  At the time of evaluation the patient's tracheostomy is leaking and probably has a blown cuff will require this to be changed out  Review of Systems:  ROS performed and is unremarkable other than noted above.  Past Medical History:  Diagnosis Date   Complication of anesthesia    wife reports after sphincterotomy in 09/2015 with general anethesia pt had some problems catching breath on awakening. also mentions he "the may have had problems putting him to sleep, i dont remember"    Coronary artery disease    Dementia ([place])    Depression    GERD (gastroesophageal reflux disease)    Heart disease    Hyperlipidemia    Kidney stone    Pneumonia    aspiration    Prostate cancer ([place])    prosate cancer   Right renal mass    Sleep apnea    Stroke [facility])     Past Surgical History:  Procedure Laterality Date   BACK SURGERY     CARDIAC CATHETERIZATION  08/07/2015   CERVICAL DISCECTOMY     CHOLECYSTECTOMY   01/21/2018   emergent   CORONARY ARTERY BYPASS GRAFT  2013   Cryoablation of Right Renal Clear Cell Carcinoma Right 01/25/2016   CSF SHUNT     EYE SURGERY     IR GENERIC HISTORICAL  02/27/2016   IR RADIOLOGIST EVAL & MGMT 02/27/2016 Aletta Edouard, MD [facility] INTERV RAD   IR GENERIC HISTORICAL  12/07/2015   IR RADIOLOGIST EVAL & MGMT 12/07/2015 Aletta Edouard, MD [facility]   IR GENERIC HISTORICAL  05/08/2016   IR RADIOLOGIST EVAL & MGMT 05/08/2016 Aletta Edouard, MD [facility]   IR RADIOLOGIST EVAL & MGMT  07/17/2016   IR RADIOLOGIST EVAL & MGMT  06/11/2017   IR RADIOLOGIST EVAL & MGMT  03/04/2018   IR RADIOLOGIST EVAL & MGMT  07/07/2019   IR RADIOLOGIST EVAL & MGMT  08/03/2020   LITHOTRIPSY     LUMBAR LAMINECTOMY     MITRAL VALVE REPAIR     PARS PLANA VITRECTOMY W/ REPAIR OF MACULAR HOLE     PROSTATE SURGERY     RADIOFREQUENCY ABLATION Right 06/21/2016   Procedure: RIGHT RENAL CRYO ABLATION;  Surgeon: Aletta Edouard, MD;  Location: [facility];  Service: Anesthesiology;  Laterality: Right;   RETINAL DETACHMENT REPAIR W/ SCLERAL BUCKLE LE     SKIN GRAFT      Social History:    reports that he quit smoking about 46 years ago. His smoking use included cigarettes. He started smoking about 72 years ago.  He has a 26.00 pack-year smoking history. He has never used smokeless tobacco. He reports that he does not drink alcohol. No history on file for drug use.  Family History: Non-Contributory to the present illness  Allergies  Allergen Reactions   Cetirizine & Related Swelling    Swelling of tongue   Triamcinolone Acetonide Rash    Redness, flush, increased BP (Kenalog Steroid injection)   Celecoxib Nausea Only   Ezetimibe Other (See Comments)    Fatigue/sleepy   Hydrochlorothiazide Palpitations   Naproxen Other (See Comments)    GI upset    Medications: Reviewed on Rounds  Physical Exam:  Vitals: Temperature 97.6 pulse 84 respiratory 24 blood pressure 131/72 saturations  100%  Ventilator Settings on assist control FiO2 is 28% tidal volume 500 PEEP of 5  General: Comfortable at this time Eyes: Grossly normal lids, irises & conjunctiva ENT: grossly tongue is normal Neck: no obvious mass Cardiovascular: S1-S2 normal no gallop or rub Respiratory: No rhonchi very coarse breath sounds Abdomen: Soft and nontender Skin: no rash seen on limited exam Musculoskeletal: not rigid Psychiatric:unable to assess Neurologic: no seizure no involuntary movements         Labs on Admission:  Basic Metabolic Panel: Recent Labs  Lab 02/07/21 0445  NA 133*  K 4.2  CL 99  CO2 28  GLUCOSE 112*  BUN 17  CREATININE 0.61  CALCIUM 8.6*    Recent Labs  Lab 02/06/21 1840  PHART 7.475*  PCO2ART 37.6  PO2ART 118*  HCO3 27.3  O2SAT 99.2    Liver Function Tests: No results for input(s): AST, ALT, ALKPHOS, BILITOT, PROT, ALBUMIN in the last 168 hours. No results for input(s): LIPASE, AMYLASE in the last 168 hours. No results for input(s): AMMONIA in the last 168 hours.  CBC: Recent Labs  Lab 02/07/21 0445  WBC 7.0  HGB 9.9*  HCT 32.2*  MCV 89.0  PLT 305    Cardiac Enzymes: No results for input(s): CKTOTAL, CKMB, CKMBINDEX, TROPONINI in the last 168 hours.  BNP (last 3 results) No results for input(s): BNP in the last 8760 hours.  ProBNP (last 3 results) No results for input(s): PROBNP in the last 8760 hours.   Radiological Exams on Admission: DG ABDOMEN PEG TUBE LOCATION  Result Date: 02/06/2021 CLINICAL DATA:  Trach tube. EXAM: ABDOMEN - 1 VIEW COMPARISON:  None. FINDINGS: Percutaneous gastrostomy with tip in the epigastric area. Injected contrast opacifies the stomach and proximal jejunum. No extraluminal contrast is noted to suggest spillage. IMPRESSION: Percutaneous gastrostomy with tip in the epigastric area likely in the distal body of the stomach. Electronically Signed   By: Anner Crete M.D.   On: 02/06/2021 22:06     Assessment/Plan Active Problems:   Acute on chronic respiratory failure with hypoxia (HCC)   Traumatic intracranial hemorrhage with loss of consciousness, sequela (HCC)   Chronic HFrEF (heart failure with reduced ejection fraction) (HCC)   AF (paroxysmal atrial fibrillation) (HCC)   Dementia without behavioral disturbance (HCC)   Acute on chronic respiratory failure with hypoxia plan is to continue with full support on the ventilator however today will have the tracheostomy changed out as patient has a leaky cuff. Traumatic intracranial hemorrhage with sequela patient remains unchanged as far as his mental status is concerned he still is significantly encephalopathic. Chronic heart failure with reduced ejection fraction plan is going to be to monitor the patient's fluid status closely diurese as tolerated Paroxysmal atrial fibrillation right now rate is controlled we  will continue to follow along closely. Dementia no change supportive care  I have personally seen and evaluated the patient, evaluated laboratory and imaging results, formulated the assessment and plan and placed orders. The Patient requires high complexity decision making with multiple systems involvement.  Case was discussed on Rounds with the Respiratory Therapy Director and the Respiratory staff Time Spent 73minutes  Shakir Petrosino A Kamorie Aldous, MD [facility] Pulmonary Critical Care Medicine Sleep Medicine

## 2021-02-08 ENCOUNTER — Other Ambulatory Visit: Payer: Self-pay

## 2021-02-08 ENCOUNTER — Encounter (HOSPITAL_COMMUNITY): Payer: Self-pay | Admitting: Emergency Medicine

## 2021-02-08 ENCOUNTER — Emergency Department (HOSPITAL_COMMUNITY)
Admission: EM | Admit: 2021-02-08 | Discharge: 2021-02-08 | Disposition: A | Discharge status: Home / Self Care | Payer: Medicare Other | Attending: Emergency Medicine | Admitting: Emergency Medicine

## 2021-02-08 ENCOUNTER — Institutional Professional Consult (permissible substitution) (HOSPITAL_COMMUNITY)
Admission: RE | Admit: 2021-02-08 | Discharge: 2021-03-06 | Disposition: A | Discharge status: Skilled Nursing Facility | Payer: Medicare Other | Source: Ambulatory Visit | Attending: Internal Medicine | Admitting: Internal Medicine

## 2021-02-08 DIAGNOSIS — Z85528 Personal history of other malignant neoplasm of kidney: Secondary | ICD-10-CM | POA: Insufficient documentation

## 2021-02-08 DIAGNOSIS — Z8546 Personal history of malignant neoplasm of prostate: Secondary | ICD-10-CM | POA: Diagnosis not present

## 2021-02-08 DIAGNOSIS — Z951 Presence of aortocoronary bypass graft: Secondary | ICD-10-CM | POA: Diagnosis not present

## 2021-02-08 DIAGNOSIS — Z87891 Personal history of nicotine dependence: Secondary | ICD-10-CM | POA: Diagnosis not present

## 2021-02-08 DIAGNOSIS — F0390 Unspecified dementia without behavioral disturbance: Secondary | ICD-10-CM | POA: Insufficient documentation

## 2021-02-08 DIAGNOSIS — J9503 Malfunction of tracheostomy stoma: Secondary | ICD-10-CM | POA: Insufficient documentation

## 2021-02-08 DIAGNOSIS — I480 Paroxysmal atrial fibrillation: Secondary | ICD-10-CM | POA: Diagnosis not present

## 2021-02-08 DIAGNOSIS — I5022 Chronic systolic (congestive) heart failure: Secondary | ICD-10-CM | POA: Diagnosis not present

## 2021-02-08 DIAGNOSIS — R079 Chest pain, unspecified: Secondary | ICD-10-CM

## 2021-02-08 DIAGNOSIS — I2510 Atherosclerotic heart disease of native coronary artery without angina pectoris: Secondary | ICD-10-CM | POA: Insufficient documentation

## 2021-02-08 DIAGNOSIS — J9621 Acute and chronic respiratory failure with hypoxia: Secondary | ICD-10-CM | POA: Diagnosis present

## 2021-02-08 DIAGNOSIS — Z930 Tracheostomy status: Secondary | ICD-10-CM

## 2021-02-08 DIAGNOSIS — S06309S Unspecified focal traumatic brain injury with loss of consciousness of unspecified duration, sequela: Secondary | ICD-10-CM

## 2021-02-08 DIAGNOSIS — Z7982 Long term (current) use of aspirin: Secondary | ICD-10-CM | POA: Diagnosis not present

## 2021-02-08 DIAGNOSIS — R109 Unspecified abdominal pain: Secondary | ICD-10-CM

## 2021-02-08 DIAGNOSIS — J189 Pneumonia, unspecified organism: Secondary | ICD-10-CM

## 2021-02-08 DIAGNOSIS — D72829 Elevated white blood cell count, unspecified: Secondary | ICD-10-CM

## 2021-02-08 DIAGNOSIS — J9500 Unspecified tracheostomy complication: Secondary | ICD-10-CM

## 2021-02-08 MED ORDER — LORAZEPAM 2 MG/ML IJ SOLN
1.0000 mg | Freq: Once | INTRAMUSCULAR | Status: AC
  Administered 2021-02-08: 1 mg via INTRAVENOUS
  Filled 2021-02-08: qty 1

## 2021-02-08 MED ORDER — FENTANYL CITRATE PF 50 MCG/ML IJ SOSY
100.0000 ug | PREFILLED_SYRINGE | Freq: Once | INTRAMUSCULAR | Status: AC
  Administered 2021-02-08: 100 ug via INTRAVENOUS
  Filled 2021-02-08: qty 2

## 2021-02-08 NOTE — ED Notes (Signed)
Report called to Davy Pique, Therapist, sports at [facility]	[place]

## 2021-02-08 NOTE — Consult Note (Signed)
Reason for Consult: Trach complication Referring Physician:'s emergency room  Kenneth Todd is an 87 y.o. male.  [facility] at [facility].  The patient apparently has a cough issue with it deflating.  The trach was changed and the cuff immediately deflated.  He is now here for evaluation and treatment.  He is vent dependent secondary to a stroke.  Past Medical History:  Diagnosis Date   Complication of anesthesia    wife reports after sphincterotomy in 09/2015 with general anethesia pt had some problems catching breath on awakening. also mentions he "the may have had problems putting him to sleep, i dont remember"    Coronary artery disease    Dementia ([place])    Depression    GERD (gastroesophageal reflux disease)    Heart disease    Hyperlipidemia    Kidney stone    Pneumonia    aspiration    Prostate cancer ([place])    prosate cancer   Right renal mass    Sleep apnea    Stroke [facility])     Past Surgical History:  Procedure Laterality Date   BACK SURGERY     CARDIAC CATHETERIZATION  08/07/2015   CERVICAL DISCECTOMY     CHOLECYSTECTOMY  01/21/2018   emergent   CORONARY ARTERY BYPASS GRAFT  2013   Cryoablation of Right Renal Clear Cell Carcinoma Right 01/25/2016   CSF SHUNT     EYE SURGERY     IR GENERIC HISTORICAL  02/27/2016   IR RADIOLOGIST EVAL & MGMT 02/27/2016 Aletta Edouard, MD [facility] INTER[facility] RAD   IR GENERIC HISTORICAL  12/07/2015   IR RADIOLOGIST EVAL & MGMT 12/07/2015 Aletta Edouard, MD [facility] INTERV RAD   IR GENERIC HISTORICAL  05/08/2016   IR RADIOLOGIST EVAL & MGMT 05/08/2016 Aletta Edouard, MD [facility] RAD   IR RADIOLOGIST EVAL & MGMT  07/17/2016   IR RADIOLOGIST EVAL & MGMT  06/11/2017   IR RADIOLOGIST EVAL & MGMT  03/04/2018   IR RADIOLOGIST EVAL & MGMT  07/07/2019   IR RADIOLOGIST EVAL & MGMT  08/03/2020   LITHOTRIPSY     LUMBAR LAMINECTOMY     MITRAL VALVE REPAIR     PARS PLANA VITRECTOMY W/ REPAIR  OF MACULAR HOLE     PROSTATE SURGERY     RADIOFREQUENCY ABLATION Right 06/21/2016   Procedure: RIGHT RENAL CRYO ABLATION;  Surgeon: Aletta Edouard, MD;  Location: [facility];  Service: Anesthesiology;  Laterality: Right;   RETINAL DETACHMENT REPAIR W/ SCLERAL BUCKLE LE     SKIN GRAFT      History reviewed. No pertinent family history.  Social History:  reports that he quit smoking about 46 years ago. His smoking use included cigarettes. He started smoking about 72 years ago. He has a 26.00 pack-year smoking history. He has never used smokeless tobacco. He reports that he does not drink alcohol. No history on file for drug use.  Allergies:  Allergies  Allergen Reactions   Cetirizine & Related Swelling    Swelling of tongue   Triamcinolone Acetonide Rash    Redness, flush, increased BP (Kenalog Steroid injection)   Celecoxib Nausea Only   Ezetimibe Other (See Comments)    Fatigue/sleepy   Hydrochlorothiazide Palpitations   Naproxen Other (See Comments)    GI upset    Medications: I have reviewed the patient's current medications.  Results for orders placed or performed during the hospital encounter of 02/06/21 (from  the past 48 hour(s))  Blood gas, arterial     Status: Abnormal   Collection Time: 02/06/21  6:40 PM  Result Value Ref Range   FIO2 28.00    pH, Arterial 7.475 (H) 7.350 - 7.450   pCO2 arterial 37.6 32.0 - 48.0 mmHg   pO2, Arterial 118 (H) 83.0 - 108.0 mmHg   Bicarbonate 27.3 20.0 - 28.0 mmol/L   Acid-Base Excess 3.8 (H) 0.0 - 2.0 mmol/L   O2 Saturation 99.2 %   Patient temperature 37.0    Collection site RIGHT RADIAL    Drawn by COLLECTED BY RT     Comment: FB POWERS RRT   Sample type ARTERIAL DRAW    Allens test (pass/fail) PASS PASS    Comment: Performed at [facility], [place] [address], [place], [place] [address]  Basic metabolic panel     Status: Abnormal   Collection Time: 02/07/21  4:45 AM  Result Value Ref Range   Sodium 133 (L) 135 - 145 mmol/L    Potassium 4.2 3.5 - 5.1 mmol/L   Chloride 99 98 - 111 mmol/L   CO2 28 22 - 32 mmol/L   Glucose, Bld 112 (H) 70 - 99 mg/dL    Comment: Glucose reference range applies only to samples taken after fasting for at least 8 hours.   BUN 17 8 - 23 mg/dL   Creatinine, Ser 0.61 0.61 - 1.24 mg/dL   Calcium 8.6 (L) 8.9 - 10.3 mg/dL   GFR, Estimated >60 >60 mL/min    Comment: (NOTE) Calculated using the CKD-EPI Creatinine Equation (2021)    Anion gap 6 5 - 15    Comment: Performed at [facility] [address] [place], [place] [address]  CBC     Status: Abnormal   Collection Time: 02/07/21  4:45 AM  Result Value Ref Range   WBC 7.0 4.0 - 10.5 K/uL   RBC 3.62 (L) 4.22 - 5.81 MIL/uL   Hemoglobin 9.9 (L) 13.0 - 17.0 g/dL   HCT 32.2 (L) 39.0 - 52.0 %   MCV 89.0 80.0 - 100.0 fL   MCH 27.3 26.0 - 34.0 pg   MCHC 30.7 30.0 - 36.0 g/dL   RDW 15.2 11.5 - 15.5 %   Platelets 305 150 - 400 K/uL   nRBC 0.0 0.0 - 0.2 %    Comment: Performed at [facility], [place] [address] [place], [place] [address]    DG ABDOMEN PEG TUBE LOCATION  Result Date: 02/06/2021 CLINICAL DATA:  Trach tube. EXAM: ABDOMEN - 1 VIEW COMPARISON:  None. FINDINGS: Percutaneous gastrostomy with tip in the epigastric area. Injected contrast opacifies the stomach and proximal jejunum. No extraluminal contrast is noted to suggest spillage. IMPRESSION: Percutaneous gastrostomy with tip in the epigastric area likely in the distal body of the stomach. Electronically Signed   By: Anner Crete M.D.   On: 02/06/2021 22:06   DG Chest Port 1 View  Result Date: 02/07/2021 CLINICAL DATA:  Pneumonia, tracheostomy present EXAM: PORTABLE CHEST 1 VIEW COMPARISON:  06/17/2020 FINDINGS: Tracheostomy device is present. No new consolidation or edema. No pleural effusion. Stable cardiomediastinal contours with evidence of CABG. IMPRESSION: No acute process in the chest. Electronically Signed   By: Macy Mis M.D.   On: 02/07/2021 17:21     ROS Height 5\' 10"  (1.778 m), weight 81.2 kg. Physical Exam HENT:     Head:     Comments: Patient is awake but not able  to communicate.  The patient is on a ventilator and there is a leak coming from the trach.  The trach looks clean with no granulation tissue or bleeding.  Once ready the trach was removed and the stoma looked excellent.  There was a few pieces of cartilage hanging from superior that were right at the opening of the trachea.  Fiberoptic scope was inserted and the trachea looked excellent with no evidence of any pathology.  The trachea looked normal all the way to the carina.  A #7 Shiley which is 11.8 outer diameter was placed and balloon inflated.  It was again checked with the scope to be sure it was in good position.  The balloon stayed inflated.  The trach tie was reattached.     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  The #8 Shiley was removed and the trachea was examined.  There is some few pieces of somewhat sharp cartilage right at the opening of the trachea which is likely where the trach balloon was perforated.  #7 Shiley which is sized for outer diameter 11.8 was inserted without difficulty.  The balloon was inflated and there was persistence of balloon inflation.  The scope was again placed into the trach to check position and it looked excellent.  The entire trachea looked normal.  [facility]  Melissa Montane 02/08/2021, 1:16 PM

## 2021-02-08 NOTE — Progress Notes (Signed)
Pulmonary Critical Care Medicine [place]   PULMONARY CRITICAL CARE SERVICE  PROGRESS NOTE     Kenneth Todd  PYP:[id number]  DOB: [birth year]   DOA: 02/06/2021  Referring Physician: Satira Sark, MD  HPI: Kenneth Todd is a 87 y.o. male being followed for ventilator/airway/oxygen weaning Acute on Chronic Respiratory Failure.  Patient is comfortable right now without distress has been on pressure support mode.  Yesterday his tracheostomy was changed out and the new tracheostomy also has developed a cuff leak which raises concern if there is some other reason for the cuff leak to be developing  Medications: Reviewed on Rounds  Physical Exam:  Vitals: Temperature is 97.3 pulse 78 respiratory 20 blood pressure is 128/97 saturations 99%  Ventilator Settings on pressure support FiO2 is 28% pressure 10/5  General: Comfortable at this time Neck: supple Cardiovascular: no malignant arrhythmias Respiratory: Scattered rhonchi expansion is equal Skin: no rash seen on limited exam Musculoskeletal: No gross abnormality Psychiatric:unable to assess Neurologic:no involuntary movements         Lab Data:   Basic Metabolic Panel: Recent Labs  Lab 02/07/21 0445  NA 133*  K 4.2  CL 99  CO2 28  GLUCOSE 112*  BUN 17  CREATININE 0.61  CALCIUM 8.6*    ABG: Recent Labs  Lab 02/06/21 1840  PHART 7.475*  PCO2ART 37.6  PO2ART 118*  HCO3 27.3  O2SAT 99.2    Liver Function Tests: No results for input(s): AST, ALT, ALKPHOS, BILITOT, PROT, ALBUMIN in the last 168 hours. No results for input(s): LIPASE, AMYLASE in the last 168 hours. No results for input(s): AMMONIA in the last 168 hours.  CBC: Recent Labs  Lab 02/07/21 0445  WBC 7.0  HGB 9.9*  HCT 32.2*  MCV 89.0  PLT 305    Cardiac Enzymes: No results for input(s): CKTOTAL, CKMB, CKMBINDEX, TROPONINI in the last 168 hours.  BNP (last 3 results) No results for input(s): BNP in the last  8760 hours.  ProBNP (last 3 results) No results for input(s): PROBNP in the last 8760 hours.  Radiological Exams: DG ABDOMEN PEG TUBE LOCATION  Result Date: 02/06/2021 CLINICAL DATA:  Trach tube. EXAM: ABDOMEN - 1 VIEW COMPARISON:  None. FINDINGS: Percutaneous gastrostomy with tip in the epigastric area. Injected contrast opacifies the stomach and proximal jejunum. No extraluminal contrast is noted to suggest spillage. IMPRESSION: Percutaneous gastrostomy with tip in the epigastric area likely in the distal body of the stomach. Electronically Signed   By: Anner Crete M.D.   On: 02/06/2021 22:06   DG Chest Port 1 View  Result Date: 02/07/2021 CLINICAL DATA:  Pneumonia, tracheostomy present EXAM: PORTABLE CHEST 1 VIEW COMPARISON:  06/17/2020 FINDINGS: Tracheostomy device is present. No new consolidation or edema. No pleural effusion. Stable cardiomediastinal contours with evidence of CABG. IMPRESSION: No acute process in the chest. Electronically Signed   By: Macy Mis M.D.   On: 02/07/2021 17:21    Assessment/Plan Active Problems:   Acute on chronic respiratory failure with hypoxia (HCC)   Traumatic intracranial hemorrhage with loss of consciousness, sequela (HCC)   Chronic HFrEF (heart failure with reduced ejection fraction) (HCC)   AF (paroxysmal atrial fibrillation) (HCC)   Dementia without behavioral disturbance (HCC)   Acute on chronic respiratory failure hypoxia plan is going to be to continue with pressure support 10/5 will be needing a ENT evaluation t regarding the issues with the cough  traumatic intracranial hemorrhage supportive care we will continue with  supportive care Chronic heart failure reduced ejection fraction supportive care Atrial fibrillation rate is controlled at this time Dementia no change we will continue to follow along closely   I have personally seen and evaluated the patient, evaluated laboratory and imaging results, formulated the assessment  and plan and placed orders. The Patient requires high complexity decision making with multiple systems involvement.  Rounds were done with the Respiratory Therapy Director and Staff therapists and discussed with nursing staff also.  Allyne Gee, MD [facility] Pulmonary Critical Care Medicine Sleep Medicine

## 2021-02-08 NOTE — ED Triage Notes (Signed)
[facility]/o leak in [facility] cuff. Pt was diagnosis with CVA 4 weeks ago at [facility] and placed on ventilator. 5 days ago trach was placed for long term ventilation. [facility] to [facility]. Pt had a leak in trach yesterday. Trach changed to Shiley 8. Pt continuing to have a leak with new cuff so [facility] pt to ED for ENT consult.

## 2021-02-08 NOTE — Discharge Instructions (Signed)
Follow-up with ENT as needed.  There may be some irritation on the stoma cartilage that caused the balloon to be damaged upon insertion on the previous times.

## 2021-02-08 NOTE — ED Provider Notes (Signed)
[place] EMERGENCY DEPARTMENT Provider Note   CSN: [id number] Arrival date & time:        History Chief Complaint  Patient presents with   leak in trach cuff     Kenneth Todd is a 87 y.o. male. Level 5 caveat due to nonverbal status. [facility].  Lurline Idol has been present for around 5 days.  Placed at [facility].  Has had recurrent cuff leaks.  Now 2 different balloons have gone flat in the last day or 2.  Pulmonary reportedly saw him and thought there may be something popping the balloons in his trach and sent down here for ENT consultation.  Patient is nonverbal.  At reported baseline.  Discussed with wife.  Patient reportedly will look around some but nonverbal.  Had large intracranial hemorrhage recently.    Past Medical History:  Diagnosis Date   Complication of anesthesia    wife reports after sphincterotomy in 09/2015 with general anethesia pt had some problems catching breath on awakening. also mentions he "the may have had problems putting him to sleep, i dont remember"    Coronary artery disease    Dementia ([place])    Depression    GERD (gastroesophageal reflux disease)    Heart disease    Hyperlipidemia    Kidney stone    Pneumonia    aspiration    Prostate cancer ([place])    prosate cancer   Right renal mass    Sleep apnea    Stroke [facility])     Patient Active Problem List   Diagnosis Date Noted   Acute on chronic respiratory failure with hypoxia ([place]) 02/07/2021   Traumatic intracranial hemorrhage with loss of consciousness, sequela ([place]) 02/07/2021   Chronic HFrEF (heart failure with reduced ejection fraction) ([place]) 02/07/2021   AF (paroxysmal atrial fibrillation) ([place]) 02/07/2021   Dementia without behavioral disturbance ([place]) 02/07/2021   Renal carcinoma, right ([place]) 06/21/2016    Past Surgical History:  Procedure Laterality Date   BACK SURGERY     CARDIAC CATHETERIZATION   08/07/2015   CERVICAL DISCECTOMY     CHOLECYSTECTOMY  01/21/2018   emergent   CORONARY ARTERY BYPASS GRAFT  2013   Cryoablation of Right Renal Clear Cell Carcinoma Right 01/25/2016   CSF SHUNT     EYE SURGERY     IR GENERIC HISTORICAL  02/27/2016   IR RADIOLOGIST EVAL & MGMT 02/27/2016 Aletta Edouard, MD [facility] INTERV RAD   IR GENERIC HISTORICAL  12/07/2015   IR RADIOLOGIST EVAL & MGMT 12/07/2015 Aletta Edouard, MD [facility] INTERV RAD   IR GENERIC HISTORICAL  05/08/2016   IR RADIOLOGIST EVAL & MGMT 05/08/2016 Aletta Edouard, MD [facility] INTERV RAD   IR RADIOLOGIST EVAL & MGMT  07/17/2016   IR RADIOLOGIST EVAL & MGMT  06/11/2017   IR RADIOLOGIST EVAL & MGMT  03/04/2018   IR RADIOLOGIST EVAL & MGMT  07/07/2019   IR RADIOLOGIST EVAL & MGMT  08/03/2020   LITHOTRIPSY     LUMBAR LAMINECTOMY     MITRAL VALVE REPAIR     PARS PLANA VITRECTOMY W/ REPAIR OF MACULAR HOLE     PROSTATE SURGERY     RADIOFREQUENCY ABLATION Right 06/21/2016   Procedure: RIGHT RENAL CRYO ABLATION;  Surgeon: Aletta Edouard, MD;  Location: [facility] ORS;  Service: Anesthesiology;  Laterality: Right;   RETINAL DETACHMENT REPAIR W/ SCLERAL BUCKLE LE     SKIN GRAFT  No family history on file.  Social History   Tobacco Use   Smoking status: Former    Packs/day: 1.00    Years: 26.00    Pack years: 26.00    Types: Cigarettes    Start date: 12/06/1948    Quit date: 12/07/1974    Years since quitting: 46.2   Smokeless tobacco: Never  Substance Use Topics   Alcohol use: No    Comment: stopped about 7 yrs ago      Home Medications Prior to Admission medications   Medication Sig Start Date End Date Taking? Authorizing Provider  acetaminophen (TYLENOL) 500 MG tablet Take 500-1,000 mg by mouth every 6 (six) hours as needed (for pain/headache.).     [provider]  ALPRAZolam Duanne Moron) 0.5 MG tablet Take 0.25-0.5 mg by mouth 3 (three) times daily as needed for anxiety.     [provider]  amoxicillin (AMOXIL)  500 MG capsule Take 2,000 mg by mouth See admin instructions. Take 4 capsules (2000 mg) by mouth prior to procedure. 03/20/16   [provider]  aspirin 81 MG tablet Take 81 mg by mouth daily.    [provider]  atorvastatin (LIPITOR) 40 MG tablet Take 40 mg by mouth every evening.     [provider]  cholecalciferol (VITAMIN D) 1000 units tablet Take 1,000 Units by mouth every evening.     [provider]  co-enzyme Q-10 30 MG capsule Take 30 mg by mouth every evening.     [provider]  Cyanocobalamin (VITAMIN B 12 PO) Take 1 tablet by mouth every evening.     [provider]  diphenhydrAMINE (BENADRYL) 25 mg capsule Take 25 mg by mouth every 6 (six) hours as needed (for allergic reactions/rash).     [provider]  EPINEPHrine (EPIPEN IJ) Inject as directed.    [provider]  HYDROcodone-acetaminophen (NORCO) 7.5-325 MG tablet Take 1 tablet by mouth 2 (two) times daily as needed. For pain. 03/25/16   [provider]  Memantine HCl ER (NAMENDA XR) 21 MG CP24 Take 21 mg by mouth daily.    [provider]  metoprolol succinate (TOPROL-XL) 25 MG 24 hr tablet Take by mouth 2 (two) times daily.     [provider]  Multiple Vitamin (MULTIVITAMIN WITH MINERALS) TABS tablet Take 1 tablet by mouth daily.    [provider]  nitroGLYCERIN (NITROSTAT) 0.4 MG SL tablet Place 0.4 mg under the tongue every 5 (five) minutes as needed for chest pain.    [provider]  predniSONE (DELTASONE) 20 MG tablet Take 20 mg by mouth daily as needed.    [provider]  rivastigmine (EXELON) 1.5 MG capsule Take 1.5 mg by mouth 2 (two) times daily.    [provider]  sertraline (ZOLOFT) 100 MG tablet Take 50-100 mg by mouth 2 (two) times daily. Various based on patient need per patient spouse    [provider]  SIMPLY SALINE NA Place 1-2 sprays into the nose 2 (two) times  daily as needed (for nasal congestion).    [provider]  traMADol (ULTRAM) 50 MG tablet Take 50 mg by mouth 4 (four) times daily as needed (for pain.).     [provider]  valACYclovir (VALTREX) 500 MG tablet Take 500 mg by mouth 3 (three) times daily as needed (for eye virus (typically scheduled twice daily)). (Typically take twice daily)    [provider]    Allergies  Cetirizine & related, Triamcinolone acetonide, Celecoxib, Ezetimibe, Hydrochlorothiazide, and Naproxen  Review of Systems   Review of Systems  Unable to perform ROS: Patient nonverbal   Physical Exam Updated Vital Signs There were no vitals taken for this visit.  Physical Exam Vitals and nursing note reviewed.  HENT:     Head: Normocephalic.  Eyes:     General: No scleral icterus. Neck:     Comments: Trach in place.  Rather flat balloon. Cardiovascular:     Rate and Rhythm: Regular rhythm.  Pulmonary:     Comments: Mildly harsh breath sounds. Abdominal:     Comments: PEG in place.  Musculoskeletal:        General: No tenderness.  Neurological:     Comments: Nonverbal and bedridden.    ED Results / Procedures / Treatments   Labs (all labs ordered are listed, but only abnormal results are displayed) Labs Reviewed - No data to display  EKG None  Radiology DG ABDOMEN PEG TUBE LOCATION  Result Date: 02/06/2021 CLINICAL DATA:  Trach tube. EXAM: ABDOMEN - 1 VIEW COMPARISON:  None. FINDINGS: Percutaneous gastrostomy with tip in the epigastric area. Injected contrast opacifies the stomach and proximal jejunum. No extraluminal contrast is noted to suggest spillage. IMPRESSION: Percutaneous gastrostomy with tip in the epigastric area likely in the distal body of the stomach. Electronically Signed   By: Anner Crete M.D.   On: 02/06/2021 22:06   DG Chest Port 1 View  Result Date: 02/07/2021 CLINICAL DATA:  Pneumonia, tracheostomy present EXAM: PORTABLE CHEST 1 VIEW  COMPARISON:  06/17/2020 FINDINGS: Tracheostomy device is present. No new consolidation or edema. No pleural effusion. Stable cardiomediastinal contours with evidence of CABG. IMPRESSION: No acute process in the chest. Electronically Signed   By: Macy Mis M.D.   On: 02/07/2021 17:21    Procedures Procedures   Medications Ordered in ED Medications - No data to display  ED Course  I have reviewed the triage vital signs and the nursing notes.  Pertinent labs & imaging results that were available during my care of the patient were reviewed by me and considered in my medical decision making (see chart for details).    MDM Rules/Calculators/A&P                           [facility]ach balloon.  Seen here by ENT.  Trach replaced and scoped.  This 1 appears to be holding pressure.  [facility]  No other apparent acute issue Final Clinical Impression(s) / ED Diagnoses Final diagnoses:  None    Rx / DC Orders ED Discharge Orders     None        Davonna Belling, MD 02/08/21 1515

## 2021-02-09 LAB — BASIC METABOLIC PANEL
Anion gap: 9 (ref 5–15)
BUN: 21 mg/dL (ref 8–23)
CO2: 27 mmol/L (ref 22–32)
Calcium: 8.7 mg/dL — ABNORMAL LOW (ref 8.9–10.3)
Chloride: 100 mmol/L (ref 98–111)
Creatinine, Ser: 0.77 mg/dL (ref 0.61–1.24)
GFR, Estimated: >60 mL/min (ref >60)
Glucose, Bld: 120 mg/dL — ABNORMAL HIGH (ref 70–99)
Potassium: 4.3 mmol/L (ref 3.5–5.1)
Sodium: 136 mmol/L (ref 135–145)

## 2021-02-09 LAB — CBC
HCT: 33.2 % — ABNORMAL LOW (ref 39.0–52.0)
Hemoglobin: 9.8 g/dL — ABNORMAL LOW (ref 13.0–17.0)
MCH: 26.5 pg (ref 26.0–34.0)
MCHC: 29.5 g/dL — ABNORMAL LOW (ref 30.0–36.0)
MCV: 89.7 fL (ref 80.0–100.0)
Platelets: 350 10*3/uL (ref 150–400)
RBC: 3.7 MIL/uL — ABNORMAL LOW (ref 4.22–5.81)
RDW: 15.5 % (ref 11.5–15.5)
WBC: 7.1 10*3/uL (ref 4.0–10.5)
nRBC: 0 % (ref 0.0–0.2)

## 2021-02-11 ENCOUNTER — Other Ambulatory Visit (HOSPITAL_COMMUNITY): Payer: Medicare Other

## 2021-02-11 LAB — BLOOD GAS, ARTERIAL
Acid-Base Excess: 2 mmol/L (ref 0.0–2.0)
Acid-Base Excess: 2.5 mmol/L — ABNORMAL HIGH (ref 0.0–2.0)
Acid-Base Excess: 1.9 mmol/L (ref 0.0–2.0)
Bicarbonate: 25.8 mmol/L (ref 20.0–28.0)
Bicarbonate: 27.2 mmol/L (ref 20.0–28.0)
Bicarbonate: 26.1 mmol/L (ref 20.0–28.0)
Drawn by: 164
Drawn by: 164
Drawn by: 164
FIO2: 28
FIO2: 28
FIO2: 28
O2 Saturation: 97.1 %
O2 Saturation: 94.4 %
O2 Saturation: 98.5 %
Patient temperature: 37
Patient temperature: 37
Patient temperature: 37
pCO2 arterial: 38.5 mmHg (ref 32.0–48.0)
pCO2 arterial: 47.8 mmHg (ref 32.0–48.0)
pCO2 arterial: 41.6 mmHg (ref 32.0–48.0)
pH, Arterial: 7.441 (ref 7.350–7.450)
pH, Arterial: 7.374 (ref 7.350–7.450)
pH, Arterial: 7.414 (ref 7.350–7.450)
pO2, Arterial: 88.2 mmHg (ref 83.0–108.0)
pO2, Arterial: 78.3 mmHg — ABNORMAL LOW (ref 83.0–108.0)
pO2, Arterial: 117 mmHg — ABNORMAL HIGH (ref 83.0–108.0)

## 2021-02-12 LAB — BLOOD GAS, ARTERIAL
Acid-Base Excess: 2.7 mmol/L — ABNORMAL HIGH (ref 0.0–2.0)
Bicarbonate: 27.2 mmol/L (ref 20.0–28.0)
FIO2: 28
O2 Saturation: 94.3 %
Patient temperature: 37
pCO2 arterial: 45.4 mmHg (ref 32.0–48.0)
pH, Arterial: 7.394 (ref 7.350–7.450)
pO2, Arterial: 73.5 mmHg — ABNORMAL LOW (ref 83.0–108.0)

## 2021-02-13 DIAGNOSIS — Z930 Tracheostomy status: Secondary | ICD-10-CM | POA: Diagnosis not present

## 2021-02-13 DIAGNOSIS — I480 Paroxysmal atrial fibrillation: Secondary | ICD-10-CM | POA: Diagnosis not present

## 2021-02-13 DIAGNOSIS — I5022 Chronic systolic (congestive) heart failure: Secondary | ICD-10-CM | POA: Diagnosis not present

## 2021-02-13 DIAGNOSIS — J9621 Acute and chronic respiratory failure with hypoxia: Secondary | ICD-10-CM | POA: Diagnosis not present

## 2021-02-13 DIAGNOSIS — S06309S Unspecified focal traumatic brain injury with loss of consciousness of unspecified duration, sequela: Secondary | ICD-10-CM

## 2021-02-13 NOTE — Progress Notes (Signed)
Pulmonary Critical Care Medicine [place]   PULMONARY CRITICAL CARE SERVICE  PROGRESS NOTE     Kenneth Todd  ESP:[id number]  DOB: Apr 09, 1931   DOA: 02/08/2021  Referring Physician: Satira Sark, MD  HPI: Kenneth Todd is a 87 y.o. male being followed for ventilator/airway/oxygen weaning Acute on Chronic Respiratory Failure.  At this time patient is on T collar on 28% FiO2.  Last week he had been seen by ENT because of his tracheal cuff being blown despite changes.  The patient was seen by ENT trach tube was changed out and by the time that he got back to the floor was noted that the cuff was deflated again.  Cuff was inflated however it went back down again.  On reading the ENT note it appears that the patient had some bony spurs which is likely rupturing the cuff when it is inserted into the stoma.  We opted to keep the patient off the ventilator and he is actually done well since.  Serial blood gases were drawn to demonstrate stability  Medications: Reviewed on Rounds  Physical Exam:  Vitals: Temperature is 97.7 pulse 69 respiratory 27 blood pressure is 133/77 saturations 96%  Ventilator Settings currently off the ventilator on T collar on 28% FiO2  General: Comfortable at this time Neck: supple Cardiovascular: no malignant arrhythmias Respiratory: Scattered rhonchi expansion is equal Skin: no rash seen on limited exam Musculoskeletal: No gross abnormality Psychiatric:unable to assess Neurologic:no involuntary movements         Lab Data:   Basic Metabolic Panel: Recent Labs  Lab 02/07/21 0445 02/09/21 0443  NA 133* 136  K 4.2 4.3  CL 99 100  CO2 28 27  GLUCOSE 112* 120*  BUN 17 21  CREATININE 0.61 0.77  CALCIUM 8.6* 8.7*    ABG: Recent Labs  Lab 02/06/21 1840 02/11/21 0802 02/11/21 1019 02/11/21 1348 02/12/21 0600  PHART 7.475* 7.441 7.374 7.414 7.394  PCO2ART 37.6 38.5 47.8 41.6 45.4  PO2ART 118* 88.2 78.3* 117* 73.5*   HCO3 27.3 25.8 27.2 26.1 27.2  O2SAT 99.2 97.1 94.4 98.5 94.3    Liver Function Tests: No results for input(s): AST, ALT, ALKPHOS, BILITOT, PROT, ALBUMIN in the last 168 hours. No results for input(s): LIPASE, AMYLASE in the last 168 hours. No results for input(s): AMMONIA in the last 168 hours.  CBC: Recent Labs  Lab 02/07/21 0445 02/09/21 0443  WBC 7.0 7.1  HGB 9.9* 9.8*  HCT 32.2* 33.2*  MCV 89.0 89.7  PLT 305 350    Cardiac Enzymes: No results for input(s): CKTOTAL, CKMB, CKMBINDEX, TROPONINI in the last 168 hours.  BNP (last 3 results) No results for input(s): BNP in the last 8760 hours.  ProBNP (last 3 results) No results for input(s): PROBNP in the last 8760 hours.  Radiological Exams: No results found.  Assessment/Plan Active Problems:   Acute on chronic respiratory failure with hypoxia (HCC)   Traumatic intracranial hemorrhage with loss of consciousness, sequela (HCC)   Chronic HFrEF (heart failure with reduced ejection fraction) (HCC)   AF (paroxysmal atrial fibrillation) ([place])   Tracheostomy status (HCC)   Acute on chronic respiratory failure with hypoxia he has now been removed from the ventilator and liberated basically is doing well.  Plan is to continue with the T collar as tolerated. Traumatic intracranial hemorrhage no changes are noted.  There is no significant improvement Chronic heart failure reduced ejection fraction at baseline Atrial fibrillation rate now rate is controlled  Tracheostomy we have opted to keep cuff deflated and keep the patient on T collar because of recurrent blown cuffs   I have personally seen and evaluated the patient, evaluated laboratory and imaging results, formulated the assessment and plan and placed orders. The Patient requires high complexity decision making with multiple systems involvement.  Rounds were done with the Respiratory Therapy Director and Staff therapists and discussed with nursing staff also.  Allyne Gee, MD [facility] Pulmonary Critical Care Medicine Sleep Medicine

## 2021-02-14 DIAGNOSIS — I480 Paroxysmal atrial fibrillation: Secondary | ICD-10-CM | POA: Diagnosis not present

## 2021-02-14 DIAGNOSIS — I5022 Chronic systolic (congestive) heart failure: Secondary | ICD-10-CM | POA: Diagnosis not present

## 2021-02-14 DIAGNOSIS — J9621 Acute and chronic respiratory failure with hypoxia: Secondary | ICD-10-CM | POA: Diagnosis not present

## 2021-02-14 DIAGNOSIS — Z930 Tracheostomy status: Secondary | ICD-10-CM | POA: Diagnosis not present

## 2021-02-14 NOTE — Progress Notes (Signed)
Pulmonary Critical Care Medicine [place]   PULMONARY CRITICAL CARE SERVICE  PROGRESS NOTE     [place] Kenneth Todd  MVH:[id number]  DOB: 25-Apr-1930   DOA: 02/08/2021  Referring Physician: Satira Sark, MD  HPI: Kenneth Todd is a 87 y.o. male being followed for ventilator/airway/oxygen weaning Acute on Chronic Respiratory Failure.  Continues to do fine off the ventilator on T collar.  No major issue secretions are fair to moderate  Medications: Reviewed on Rounds  Physical Exam:  Vitals: Temperature is 97.8 pulse 67 respiratory is 29 blood pressure 131/67 saturations 99%  Ventilator Settings patient was off the ventilator on T collar  General: Comfortable at this time Neck: supple Cardiovascular: no malignant arrhythmias Respiratory: Scattered rhonchi expansion is equal Skin: no rash seen on limited exam Musculoskeletal: No gross abnormality Psychiatric:unable to assess Neurologic:no involuntary movements         Lab Data:   Basic Metabolic Panel: Recent Labs  Lab 02/09/21 0443  NA 136  K 4.3  CL 100  CO2 27  GLUCOSE 120*  BUN 21  CREATININE 0.77  CALCIUM 8.7*    ABG: Recent Labs  Lab 02/11/21 0802 02/11/21 1019 02/11/21 1348 02/12/21 0600  PHART 7.441 7.374 7.414 7.394  PCO2ART 38.5 47.8 41.6 45.4  PO2ART 88.2 78.3* 117* 73.5*  HCO3 25.8 27.2 26.1 27.2  O2SAT 97.1 94.4 98.5 94.3    Liver Function Tests: No results for input(s): AST, ALT, ALKPHOS, BILITOT, PROT, ALBUMIN in the last 168 hours. No results for input(s): LIPASE, AMYLASE in the last 168 hours. No results for input(s): AMMONIA in the last 168 hours.  CBC: Recent Labs  Lab 02/09/21 0443  WBC 7.1  HGB 9.8*  HCT 33.2*  MCV 89.7  PLT 350    Cardiac Enzymes: No results for input(s): CKTOTAL, CKMB, CKMBINDEX, TROPONINI in the last 168 hours.  BNP (last 3 results) No results for input(s): BNP in the last 8760 hours.  ProBNP (last 3 results) No  results for input(s): PROBNP in the last 8760 hours.  Radiological Exams: No results found.  Assessment/Plan Active Problems:   Acute on chronic respiratory failure with hypoxia (HCC)   Traumatic intracranial hemorrhage with loss of consciousness, sequela (HCC)   Chronic HFrEF (heart failure with reduced ejection fraction) (HCC)   AF (paroxysmal atrial fibrillation) ([place])   Tracheostomy status (HCC)   Acute on chronic respiratory failure hypoxia we will continue to wean on T-piece titrate oxygen as tolerated continue pulmonary toilet Traumatic intracranial hemorrhage overall no change we will continue to monitor closely Chronic heart failure reduced ejection fraction at baseline Atrial fibrillation right now rate is controlled Tracheostomy remains in place   I have personally seen and evaluated the patient, evaluated laboratory and imaging results, formulated the assessment and plan and placed orders. The Patient requires high complexity decision making with multiple systems involvement.  Rounds were done with the Respiratory Therapy Director and Staff therapists and discussed with nursing staff also.  Allyne Gee, MD [facility] Pulmonary Critical Care Medicine Sleep Medicine

## 2021-02-15 DIAGNOSIS — I5022 Chronic systolic (congestive) heart failure: Secondary | ICD-10-CM | POA: Diagnosis not present

## 2021-02-15 DIAGNOSIS — Z930 Tracheostomy status: Secondary | ICD-10-CM | POA: Diagnosis not present

## 2021-02-15 DIAGNOSIS — I480 Paroxysmal atrial fibrillation: Secondary | ICD-10-CM | POA: Diagnosis not present

## 2021-02-15 DIAGNOSIS — J9621 Acute and chronic respiratory failure with hypoxia: Secondary | ICD-10-CM | POA: Diagnosis not present

## 2021-02-15 LAB — BASIC METABOLIC PANEL
Anion gap: 6 (ref 5–15)
BUN: 25 mg/dL — ABNORMAL HIGH (ref 8–23)
CO2: 29 mmol/L (ref 22–32)
Calcium: 9 mg/dL (ref 8.9–10.3)
Chloride: 101 mmol/L (ref 98–111)
Creatinine, Ser: 0.69 mg/dL (ref 0.61–1.24)
GFR, Estimated: >60 mL/min (ref >60)
Glucose, Bld: 121 mg/dL — ABNORMAL HIGH (ref 70–99)
Potassium: 4.2 mmol/L (ref 3.5–5.1)
Sodium: 136 mmol/L (ref 135–145)

## 2021-02-15 LAB — CBC
HCT: 33.3 % — ABNORMAL LOW (ref 39.0–52.0)
Hemoglobin: 10 g/dL — ABNORMAL LOW (ref 13.0–17.0)
MCH: 26.3 pg (ref 26.0–34.0)
MCHC: 30 g/dL (ref 30.0–36.0)
MCV: 87.6 fL (ref 80.0–100.0)
Platelets: 275 10*3/uL (ref 150–400)
RBC: 3.8 MIL/uL — ABNORMAL LOW (ref 4.22–5.81)
RDW: 16 % — ABNORMAL HIGH (ref 11.5–15.5)
WBC: 8.8 10*3/uL (ref 4.0–10.5)
nRBC: 0 % (ref 0.0–0.2)

## 2021-02-15 LAB — MAGNESIUM: Magnesium: 2 mg/dL (ref 1.7–2.4)

## 2021-02-15 NOTE — Progress Notes (Signed)
Pulmonary Critical Care Medicine [place]   PULMONARY CRITICAL CARE SERVICE  PROGRESS NOTE     Kenneth Todd  [id number]:[id number]  DOB: Feb 03, 1931   DOA: 02/08/2021  Referring Physician: Satira Sark, MD  HPI: Kenneth Todd is a 87 y.o. male being followed for ventilator/airway/oxygen weaning Acute on Chronic Respiratory Failure.  Patient afebrile right now without distress off the ventilator on T collar  Medications: Reviewed on Rounds  Physical Exam:  Vitals: Temperature is 97.1 pulse 67 respiratory 23 blood pressure is 110/67 saturations 96%  Ventilator Settings off ventilator on T collar  General: Comfortable at this time Neck: supple Cardiovascular: no malignant arrhythmias Respiratory: No rhonchi very coarse breath sounds Skin: no rash seen on limited exam Musculoskeletal: No gross abnormality Psychiatric:unable to assess Neurologic:no involuntary movements         Lab Data:   Basic Metabolic Panel: Recent Labs  Lab 02/09/21 0443 02/15/21 0509  NA 136 136  K 4.3 4.2  CL 100 101  CO2 27 29  GLUCOSE 120* 121*  BUN 21 25*  CREATININE 0.77 0.69  CALCIUM 8.7* 9.0  MG  --  2.0    ABG: Recent Labs  Lab 02/11/21 0802 02/11/21 1019 02/11/21 1348 02/12/21 0600  PHART 7.441 7.374 7.414 7.394  PCO2ART 38.5 47.8 41.6 45.4  PO2ART 88.2 78.3* 117* 73.5*  HCO3 25.8 27.2 26.1 27.2  O2SAT 97.1 94.4 98.5 94.3    Liver Function Tests: No results for input(s): AST, ALT, ALKPHOS, BILITOT, PROT, ALBUMIN in the last 168 hours. No results for input(s): LIPASE, AMYLASE in the last 168 hours. No results for input(s): AMMONIA in the last 168 hours.  CBC: Recent Labs  Lab 02/09/21 0443 02/15/21 0509  WBC 7.1 8.8  HGB 9.8* 10.0*  HCT 33.2* 33.3*  MCV 89.7 87.6  PLT 350 275    Cardiac Enzymes: No results for input(s): CKTOTAL, CKMB, CKMBINDEX, TROPONINI in the last 168 hours.  BNP (last 3 results) No results for input(s): BNP  in the last 8760 hours.  ProBNP (last 3 results) No results for input(s): PROBNP in the last 8760 hours.  Radiological Exams: No results found.  Assessment/Plan Active Problems:   Acute on chronic respiratory failure with hypoxia (HCC)   Traumatic intracranial hemorrhage with loss of consciousness, sequela (HCC)   Chronic HFrEF (heart failure with reduced ejection fraction) (HCC)   AF (paroxysmal atrial fibrillation) ([place])   Tracheostomy status (HCC)   Acute on chronic respiratory failure with hypoxia patient is on T collar wean as tolerated needs to have the trach changed out in the next 3 days Traumatic intracranial hemorrhage no change we will continue with supportive care Chronic heart failure reduced ejection fraction appears to be compensated Atrial fibrillation rate is controlled Tracheostomy remains in place   I have personally seen and evaluated the patient, evaluated laboratory and imaging results, formulated the assessment and plan and placed orders. The Patient requires high complexity decision making with multiple systems involvement.  Rounds were done with the Respiratory Therapy Director and Staff therapists and discussed with nursing staff also.  Allyne Gee, MD [facility] Pulmonary Critical Care Medicine Sleep Medicine

## 2021-02-16 DIAGNOSIS — I5022 Chronic systolic (congestive) heart failure: Secondary | ICD-10-CM | POA: Diagnosis not present

## 2021-02-16 DIAGNOSIS — Z930 Tracheostomy status: Secondary | ICD-10-CM | POA: Diagnosis not present

## 2021-02-16 DIAGNOSIS — I480 Paroxysmal atrial fibrillation: Secondary | ICD-10-CM | POA: Diagnosis not present

## 2021-02-16 DIAGNOSIS — J9621 Acute and chronic respiratory failure with hypoxia: Secondary | ICD-10-CM | POA: Diagnosis not present

## 2021-02-16 NOTE — Progress Notes (Signed)
Pulmonary Critical Care Medicine [place]   PULMONARY CRITICAL CARE SERVICE  PROGRESS NOTE     Kenneth Todd  EKC:[id number]  DOB: 13-May-1930   DOA: 02/08/2021  Referring Physician: Satira Sark, MD  HPI: Kenneth Todd is a 87 y.o. male being followed for ventilator/airway/oxygen weaning Acute on Chronic Respiratory Failure.  Patient is afebrile comfortable right now without distress has been on T collar room air  Medications: Reviewed on Rounds  Physical Exam:  Vitals: Temperature is 97.6 pulse 82 respiratory 26 blood pressure is 132/76 saturations 98%  Ventilator Settings on T collar room air with PMV  General: Comfortable at this time Neck: supple Cardiovascular: no malignant arrhythmias Respiratory: No rhonchi no rales are noted at this time Skin: no rash seen on limited exam Musculoskeletal: No gross abnormality Psychiatric:unable to assess Neurologic:no involuntary movements         Lab Data:   Basic Metabolic Panel: Recent Labs  Lab 02/15/21 0509  NA 136  K 4.2  CL 101  CO2 29  GLUCOSE 121*  BUN 25*  CREATININE 0.69  CALCIUM 9.0  MG 2.0    ABG: Recent Labs  Lab 02/11/21 0802 02/11/21 1019 02/11/21 1348 02/12/21 0600  PHART 7.441 7.374 7.414 7.394  PCO2ART 38.5 47.8 41.6 45.4  PO2ART 88.2 78.3* 117* 73.5*  HCO3 25.8 27.2 26.1 27.2  O2SAT 97.1 94.4 98.5 94.3    Liver Function Tests: No results for input(s): AST, ALT, ALKPHOS, BILITOT, PROT, ALBUMIN in the last 168 hours. No results for input(s): LIPASE, AMYLASE in the last 168 hours. No results for input(s): AMMONIA in the last 168 hours.  CBC: Recent Labs  Lab 02/15/21 0509  WBC 8.8  HGB 10.0*  HCT 33.3*  MCV 87.6  PLT 275    Cardiac Enzymes: No results for input(s): CKTOTAL, CKMB, CKMBINDEX, TROPONINI in the last 168 hours.  BNP (last 3 results) No results for input(s): BNP in the last 8760 hours.  ProBNP (last 3 results) No results for  input(s): PROBNP in the last 8760 hours.  Radiological Exams: No results found.  Assessment/Plan Active Problems:   Acute on chronic respiratory failure with hypoxia (HCC)   Traumatic intracranial hemorrhage with loss of consciousness, sequela (HCC)   Chronic HFrEF (heart failure with reduced ejection fraction) (HCC)   AF (paroxysmal atrial fibrillation) ([place])   Tracheostomy status (HCC)   Acute on chronic respiratory failure with hypoxia plan is to change the trach out seems to be doing well we will continue with secretion management pulmonary toilet. Traumatic intracranial hemorrhage no change continue with supportive care Chronic heart failure reduced ejection fraction right now is compensated Atrial fibrillation rate is controlled Tracheostomy will be changed out to a cuffless trach   I have personally seen and evaluated the patient, evaluated laboratory and imaging results, formulated the assessment and plan and placed orders. The Patient requires high complexity decision making with multiple systems involvement.  Rounds were done with the Respiratory Therapy Director and Staff therapists and discussed with nursing staff also.  Allyne Gee, MD [facility] Pulmonary Critical Care Medicine Sleep Medicine

## 2021-02-17 DIAGNOSIS — I480 Paroxysmal atrial fibrillation: Secondary | ICD-10-CM | POA: Diagnosis not present

## 2021-02-17 DIAGNOSIS — I5022 Chronic systolic (congestive) heart failure: Secondary | ICD-10-CM | POA: Diagnosis not present

## 2021-02-17 DIAGNOSIS — J9621 Acute and chronic respiratory failure with hypoxia: Secondary | ICD-10-CM | POA: Diagnosis not present

## 2021-02-17 DIAGNOSIS — Z930 Tracheostomy status: Secondary | ICD-10-CM | POA: Diagnosis not present

## 2021-02-17 NOTE — Progress Notes (Signed)
Pulmonary Critical Care Medicine [place]   PULMONARY CRITICAL CARE SERVICE  PROGRESS NOTE     DEVLON DOSHER  ACZ:[id number]  DOB: 1930-04-27   DOA: 02/08/2021  Referring Physician: Satira Sark, MD  HPI: BAWI LAKINS is a 87 y.o. male being followed for ventilator/airway/oxygen weaning Acute on Chronic Respiratory Failure.  Patient is afebrile right now comfortable without distress has been capping on room air secretions are still requiring suctioning  Medications: Reviewed on Rounds  Physical Exam:  Vitals: Temperature is 96.6 pulse 109 respiratory rate is 18 blood pressure is 134/60 saturations 98%  Ventilator Settings capping on room air  General: Comfortable at this time Neck: supple Cardiovascular: no malignant arrhythmias Respiratory: Coarse breath sounds upper airway secretions Skin: no rash seen on limited exam Musculoskeletal: No gross abnormality Psychiatric:unable to assess Neurologic:no involuntary movements         Lab Data:   Basic Metabolic Panel: Recent Labs  Lab 02/15/21 0509  NA 136  K 4.2  CL 101  CO2 29  GLUCOSE 121*  BUN 25*  CREATININE 0.69  CALCIUM 9.0  MG 2.0    ABG: Recent Labs  Lab 02/11/21 0802 02/11/21 1019 02/11/21 1348 02/12/21 0600  PHART 7.441 7.374 7.414 7.394  PCO2ART 38.5 47.8 41.6 45.4  PO2ART 88.2 78.3* 117* 73.5*  HCO3 25.8 27.2 26.1 27.2  O2SAT 97.1 94.4 98.5 94.3    Liver Function Tests: No results for input(s): AST, ALT, ALKPHOS, BILITOT, PROT, ALBUMIN in the last 168 hours. No results for input(s): LIPASE, AMYLASE in the last 168 hours. No results for input(s): AMMONIA in the last 168 hours.  CBC: Recent Labs  Lab 02/15/21 0509  WBC 8.8  HGB 10.0*  HCT 33.3*  MCV 87.6  PLT 275    Cardiac Enzymes: No results for input(s): CKTOTAL, CKMB, CKMBINDEX, TROPONINI in the last 168 hours.  BNP (last 3 results) No results for input(s): BNP in the last 8760  hours.  ProBNP (last 3 results) No results for input(s): PROBNP in the last 8760 hours.  Radiological Exams: No results found.  Assessment/Plan Active Problems:   Acute on chronic respiratory failure with hypoxia (HCC)   Traumatic intracranial hemorrhage with loss of consciousness, sequela (HCC)   Chronic HFrEF (heart failure with reduced ejection fraction) (HCC)   AF (paroxysmal atrial fibrillation) (HCC)   Tracheostomy status (HCC)   Acute on chronic respiratory failure with hypoxia plan is to continue with capping trials hold off on decannulation for now Traumatic intracranial hemorrhage no change we will continue with supportive care Chronic heart failure reduced ejection fraction at baseline Atrial fibrillation rate is controlled Tracheostomy remains in place   I have personally seen and evaluated the patient, evaluated laboratory and imaging results, formulated the assessment and plan and placed orders. The Patient requires high complexity decision making with multiple systems involvement.  Rounds were done with the Respiratory Therapy Director and Staff therapists and discussed with nursing staff also.  Allyne Gee, MD [facility] Pulmonary Critical Care Medicine Sleep Medicine

## 2021-02-19 DIAGNOSIS — Z930 Tracheostomy status: Secondary | ICD-10-CM | POA: Diagnosis not present

## 2021-02-19 DIAGNOSIS — I5022 Chronic systolic (congestive) heart failure: Secondary | ICD-10-CM | POA: Diagnosis not present

## 2021-02-19 DIAGNOSIS — J9621 Acute and chronic respiratory failure with hypoxia: Secondary | ICD-10-CM | POA: Diagnosis not present

## 2021-02-19 DIAGNOSIS — I480 Paroxysmal atrial fibrillation: Secondary | ICD-10-CM | POA: Diagnosis not present

## 2021-02-19 LAB — BASIC METABOLIC PANEL
Anion gap: 7 (ref 5–15)
BUN: 23 mg/dL (ref 8–23)
CO2: 28 mmol/L (ref 22–32)
Calcium: 9 mg/dL (ref 8.9–10.3)
Chloride: 102 mmol/L (ref 98–111)
Creatinine, Ser: 0.67 mg/dL (ref 0.61–1.24)
GFR, Estimated: >60 mL/min (ref >60)
Glucose, Bld: 122 mg/dL — ABNORMAL HIGH (ref 70–99)
Potassium: 4.5 mmol/L (ref 3.5–5.1)
Sodium: 137 mmol/L (ref 135–145)

## 2021-02-19 NOTE — Progress Notes (Signed)
Pulmonary Critical Care Medicine [place]   PULMONARY CRITICAL CARE SERVICE  PROGRESS NOTE     Kenneth Todd  JYN:[id number]  DOB: Feb 08, 1931   DOA: 02/08/2021  Referring Physician: Satira Sark, MD  HPI: Kenneth Todd is a 87 y.o. male being followed for ventilator/airway/oxygen weaning Acute on Chronic Respiratory Failure.  Patient is afebrile without distress at this time has been capping for 72 hours completed protocol requisites  Medications: Reviewed on Rounds  Physical Exam:  Vitals: Temperature is 97.5 pulse 72 respiratory rate is 28 blood pressure is 136/74 saturations 98%  Ventilator Settings capping off the ventilator  General: Comfortable at this time Neck: supple Cardiovascular: no malignant arrhythmias Respiratory: No rhonchi no rales are noted at this time Skin: no rash seen on limited exam Musculoskeletal: No gross abnormality Psychiatric:unable to assess Neurologic:no involuntary movements         Lab Data:   Basic Metabolic Panel: Recent Labs  Lab 02/15/21 0509 02/19/21 0627  NA 136 137  K 4.2 4.5  CL 101 102  CO2 29 28  GLUCOSE 121* 122*  BUN 25* 23  CREATININE 0.69 0.67  CALCIUM 9.0 9.0  MG 2.0  --     ABG: No results for input(s): PHART, PCO2ART, PO2ART, HCO3, O2SAT in the last 168 hours.  Liver Function Tests: No results for input(s): AST, ALT, ALKPHOS, BILITOT, PROT, ALBUMIN in the last 168 hours. No results for input(s): LIPASE, AMYLASE in the last 168 hours. No results for input(s): AMMONIA in the last 168 hours.  CBC: Recent Labs  Lab 02/15/21 0509  WBC 8.8  HGB 10.0*  HCT 33.3*  MCV 87.6  PLT 275    Cardiac Enzymes: No results for input(s): CKTOTAL, CKMB, CKMBINDEX, TROPONINI in the last 168 hours.  BNP (last 3 results) No results for input(s): BNP in the last 8760 hours.  ProBNP (last 3 results) No results for input(s): PROBNP in the last 8760 hours.  Radiological Exams: No  results found.  Assessment/Plan Active Problems:   Acute on chronic respiratory failure with hypoxia (HCC)   Traumatic intracranial hemorrhage with loss of consciousness, sequela (HCC)   Chronic HFrEF (heart failure with reduced ejection fraction) (HCC)   AF (paroxysmal atrial fibrillation) ([place])   Tracheostomy status (HCC)   Acute on chronic respiratory failure with hypoxia plan is to proceed to decannulation we will continue to monitor along closely. Traumatic intracranial hemorrhage no change supportive care Chronic heart failure reduced ejection fraction compensated Atrial fibrillation rate is controlled Tracheostomy will be removed   I have personally seen and evaluated the patient, evaluated laboratory and imaging results, formulated the assessment and plan and placed orders. The Patient requires high complexity decision making with multiple systems involvement.  Rounds were done with the Respiratory Therapy Director and Staff therapists and discussed with nursing staff also.  Allyne Gee, MD [facility] Pulmonary Critical Care Medicine Sleep Medicine

## 2021-02-22 ENCOUNTER — Other Ambulatory Visit (HOSPITAL_COMMUNITY): Payer: Medicare Other

## 2021-02-22 LAB — CBC
HCT: 37.4 % — ABNORMAL LOW (ref 39.0–52.0)
Hemoglobin: 11.1 g/dL — ABNORMAL LOW (ref 13.0–17.0)
MCH: 25.8 pg — ABNORMAL LOW (ref 26.0–34.0)
MCHC: 29.7 g/dL — ABNORMAL LOW (ref 30.0–36.0)
MCV: 87 fL (ref 80.0–100.0)
Platelets: 246 10*3/uL (ref 150–400)
RBC: 4.3 MIL/uL (ref 4.22–5.81)
RDW: 16.4 % — ABNORMAL HIGH (ref 11.5–15.5)
WBC: 11.4 10*3/uL — ABNORMAL HIGH (ref 4.0–10.5)
nRBC: 0 % (ref 0.0–0.2)

## 2021-02-22 LAB — BASIC METABOLIC PANEL
Anion gap: 7 (ref 5–15)
BUN: 28 mg/dL — ABNORMAL HIGH (ref 8–23)
CO2: 28 mmol/L (ref 22–32)
Calcium: 8.9 mg/dL (ref 8.9–10.3)
Chloride: 102 mmol/L (ref 98–111)
Creatinine, Ser: 0.69 mg/dL (ref 0.61–1.24)
GFR, Estimated: >60 mL/min (ref >60)
Glucose, Bld: 125 mg/dL — ABNORMAL HIGH (ref 70–99)
Potassium: 4.4 mmol/L (ref 3.5–5.1)
Sodium: 137 mmol/L (ref 135–145)

## 2021-02-22 LAB — MAGNESIUM: Magnesium: 2.1 mg/dL (ref 1.7–2.4)

## 2021-02-24 ENCOUNTER — Other Ambulatory Visit (HOSPITAL_COMMUNITY): Payer: Medicare Other

## 2021-02-25 LAB — BASIC METABOLIC PANEL
Anion gap: 6 (ref 5–15)
BUN: 27 mg/dL — ABNORMAL HIGH (ref 8–23)
CO2: 31 mmol/L (ref 22–32)
Calcium: 8.8 mg/dL — ABNORMAL LOW (ref 8.9–10.3)
Chloride: 100 mmol/L (ref 98–111)
Creatinine, Ser: 0.72 mg/dL (ref 0.61–1.24)
GFR, Estimated: >60 mL/min (ref >60)
Glucose, Bld: 110 mg/dL — ABNORMAL HIGH (ref 70–99)
Potassium: 4.2 mmol/L (ref 3.5–5.1)
Sodium: 137 mmol/L (ref 135–145)

## 2021-02-25 LAB — CBC
HCT: 36.4 % — ABNORMAL LOW (ref 39.0–52.0)
Hemoglobin: 10.8 g/dL — ABNORMAL LOW (ref 13.0–17.0)
MCH: 26 pg (ref 26.0–34.0)
MCHC: 29.7 g/dL — ABNORMAL LOW (ref 30.0–36.0)
MCV: 87.7 fL (ref 80.0–100.0)
Platelets: 221 10*3/uL (ref 150–400)
RBC: 4.15 MIL/uL — ABNORMAL LOW (ref 4.22–5.81)
RDW: 16.4 % — ABNORMAL HIGH (ref 11.5–15.5)
WBC: 9 10*3/uL (ref 4.0–10.5)
nRBC: 0 % (ref 0.0–0.2)

## 2021-02-26 LAB — URINE CULTURE: Culture: NO GROWTH

## 2021-02-27 ENCOUNTER — Other Ambulatory Visit (HOSPITAL_COMMUNITY): Payer: Medicare Other

## 2021-03-02 ENCOUNTER — Other Ambulatory Visit (HOSPITAL_COMMUNITY): Payer: Medicare Other

## 2021-03-02 LAB — COMPREHENSIVE METABOLIC PANEL
ALT: 35 U/L (ref 0–44)
AST: 30 U/L (ref 15–41)
Albumin: 2.5 g/dL — ABNORMAL LOW (ref 3.5–5.0)
Alkaline Phosphatase: 96 U/L (ref 38–126)
Anion gap: 8 (ref 5–15)
BUN: 32 mg/dL — ABNORMAL HIGH (ref 8–23)
CO2: 30 mmol/L (ref 22–32)
Calcium: 9 mg/dL (ref 8.9–10.3)
Chloride: 100 mmol/L (ref 98–111)
Creatinine, Ser: 0.85 mg/dL (ref 0.61–1.24)
GFR, Estimated: >60 mL/min (ref >60)
Glucose, Bld: 124 mg/dL — ABNORMAL HIGH (ref 70–99)
Potassium: 4.6 mmol/L (ref 3.5–5.1)
Sodium: 138 mmol/L (ref 135–145)
Total Bilirubin: 0.5 mg/dL (ref 0.3–1.2)
Total Protein: 6 g/dL — ABNORMAL LOW (ref 6.5–8.1)

## 2021-03-02 LAB — TROPONIN I (HIGH SENSITIVITY)
Troponin I (High Sensitivity): 12 ng/L (ref <18)
Troponin I (High Sensitivity): 11 ng/L (ref <18)
Troponin I (High Sensitivity): 9 ng/L (ref <18)

## 2021-03-02 LAB — CBC
HCT: 34.4 % — ABNORMAL LOW (ref 39.0–52.0)
Hemoglobin: 10.3 g/dL — ABNORMAL LOW (ref 13.0–17.0)
MCH: 26.1 pg (ref 26.0–34.0)
MCHC: 29.9 g/dL — ABNORMAL LOW (ref 30.0–36.0)
MCV: 87.1 fL (ref 80.0–100.0)
Platelets: 252 10*3/uL (ref 150–400)
RBC: 3.95 MIL/uL — ABNORMAL LOW (ref 4.22–5.81)
RDW: 16.5 % — ABNORMAL HIGH (ref 11.5–15.5)
WBC: 7.8 10*3/uL (ref 4.0–10.5)
nRBC: 0 % (ref 0.0–0.2)

## 2021-03-02 LAB — CK TOTAL AND CKMB (NOT AT ARMC)
CK, MB: 1.5 ng/mL (ref 0.5–5.0)
Relative Index: INVALID (ref 0.0–2.5)
Total CK: 47 U/L — ABNORMAL LOW (ref 49–397)

## 2021-03-02 LAB — BRAIN NATRIURETIC PEPTIDE: B Natriuretic Peptide: 42.1 pg/mL (ref 0.0–100.0)

## 2021-03-02 LAB — TYPE AND SCREEN
ABO/RH(D): O POS
Antibody Screen: NEGATIVE

## 2021-03-02 LAB — PHOSPHORUS: Phosphorus: 4.2 mg/dL (ref 2.5–4.6)

## 2021-03-02 LAB — MAGNESIUM: Magnesium: 2.3 mg/dL (ref 1.7–2.4)

## 2021-06-08 ENCOUNTER — Other Ambulatory Visit: Payer: Self-pay | Admitting: Interventional Radiology

## 2021-06-08 DIAGNOSIS — N2889 Other specified disorders of kidney and ureter: Secondary | ICD-10-CM

## 2022-03-15 DEATH — deceased
